# Patient Record
Sex: Female | Born: 1990 | Race: White | Hispanic: No | Marital: Married | Smoking: Never smoker
Health system: Southern US, Community
[De-identification: ages and names within clinical notes are randomized; demographics above are authoritative.]

## PROBLEM LIST (undated history)

## (undated) DIAGNOSIS — J309 Allergic rhinitis, unspecified: Secondary | ICD-10-CM

## (undated) DIAGNOSIS — K8020 Calculus of gallbladder without cholecystitis without obstruction: Secondary | ICD-10-CM

## (undated) DIAGNOSIS — T7840XA Allergy, unspecified, initial encounter: Secondary | ICD-10-CM

## (undated) DIAGNOSIS — Z803 Family history of malignant neoplasm of breast: Secondary | ICD-10-CM

## (undated) DIAGNOSIS — F32A Depression, unspecified: Secondary | ICD-10-CM

## (undated) DIAGNOSIS — F419 Anxiety disorder, unspecified: Secondary | ICD-10-CM

## (undated) DIAGNOSIS — M1990 Unspecified osteoarthritis, unspecified site: Secondary | ICD-10-CM

## (undated) DIAGNOSIS — N200 Calculus of kidney: Secondary | ICD-10-CM

## (undated) HISTORY — PX: CHOLECYSTECTOMY: SHX55

## (undated) HISTORY — DX: Unspecified osteoarthritis, unspecified site: M19.90

## (undated) HISTORY — DX: Depression, unspecified: F32.A

## (undated) HISTORY — DX: Anxiety disorder, unspecified: F41.9

## (undated) HISTORY — DX: Family history of malignant neoplasm of breast: Z80.3

## (undated) HISTORY — DX: Calculus of gallbladder without cholecystitis without obstruction: K80.20

## (undated) HISTORY — PX: OTHER SURGICAL HISTORY: SHX169

## (undated) HISTORY — DX: Allergy, unspecified, initial encounter: T78.40XA

## (undated) HISTORY — DX: Allergic rhinitis, unspecified: J30.9

## (undated) HISTORY — DX: Calculus of kidney: N20.0

---

## 2012-08-03 ENCOUNTER — Emergency Department (HOSPITAL_COMMUNITY)
Admission: EM | Admit: 2012-08-03 | Discharge: 2012-08-03 | Disposition: A | Discharge status: Home / Self Care | Payer: Self-pay | Attending: Emergency Medicine | Admitting: Emergency Medicine

## 2012-08-03 ENCOUNTER — Emergency Department (HOSPITAL_COMMUNITY): Payer: Self-pay

## 2012-08-03 ENCOUNTER — Encounter (HOSPITAL_COMMUNITY): Payer: Self-pay | Admitting: Emergency Medicine

## 2012-08-03 DIAGNOSIS — N200 Calculus of kidney: Secondary | ICD-10-CM

## 2012-08-03 LAB — URINE MICROSCOPIC-ADD ON

## 2012-08-03 LAB — CBC
HCT: 37 % (ref 36.0–46.0)
Hemoglobin: 12.5 g/dL (ref 12.0–15.0)
MCV: 88.3 fL (ref 78.0–100.0)
RDW: 12.8 % (ref 11.5–15.5)
WBC: 6.9 10*3/uL (ref 4.0–10.5)

## 2012-08-03 LAB — COMPREHENSIVE METABOLIC PANEL
Albumin: 4.3 g/dL (ref 3.5–5.2)
Alkaline Phosphatase: 35 U/L — ABNORMAL LOW (ref 39–117)
BUN: 10 mg/dL (ref 6–23)
CO2: 23 mEq/L (ref 19–32)
Chloride: 105 mEq/L (ref 96–112)
Creatinine, Ser: 0.61 mg/dL (ref 0.50–1.10)
GFR calc Af Amer: >90 mL/min (ref >90)
GFR calc non Af Amer: >90 mL/min (ref >90)
Glucose, Bld: 87 mg/dL (ref 70–99)
Potassium: 3.4 mEq/L — ABNORMAL LOW (ref 3.5–5.1)
Total Bilirubin: 0.2 mg/dL — ABNORMAL LOW (ref 0.3–1.2)

## 2012-08-03 LAB — URINALYSIS, ROUTINE W REFLEX MICROSCOPIC
Glucose, UA: NEGATIVE mg/dL
Protein, ur: NEGATIVE mg/dL
pH: 5.5 (ref 5.0–8.0)

## 2012-08-03 LAB — POCT PREGNANCY, URINE: Preg Test, Ur: NEGATIVE

## 2012-08-03 MED ORDER — HYDROCODONE-ACETAMINOPHEN 5-500 MG PO TABS
1.0000 | ORAL_TABLET | Freq: Four times a day (QID) | ORAL | Status: AC | PRN
Start: 2012-08-03 — End: 2012-08-13

## 2012-08-03 NOTE — ED Notes (Signed)
PT is eatting; encouraged not to eat anymore; pt has no signs of distress at this time.

## 2012-08-03 NOTE — ED Notes (Signed)
Pt to xray

## 2012-08-03 NOTE — ED Notes (Signed)
Pt reported increased pain; RN reassured pt and encouraged her to use deep breathing; pt is afebrile.

## 2012-08-03 NOTE — ED Notes (Signed)
RLQ pain that radiates to R flank since 3:15pm today.  Reports diarrhea.  Denies urinary complaints, nausea, and vomiting.

## 2012-08-03 NOTE — ED Provider Notes (Signed)
History     CSN: [id number]  Arrival date & time 08/03/12  1706   First MD Initiated Contact with Patient 08/03/12 2114      Chief Complaint  Patient presents with  . Abdominal Pain    (Consider location/radiation/quality/duration/timing/severity/associated sxs/prior treatment) HPI Comments: Patient started at 3 PM with right flank pain radiating in to the right lower quadrant.  It is severe at times and seems to come and go.  Never had before.  Patient is a 21 y.o. female presenting with abdominal pain. The history is provided by the patient.  Abdominal Pain The primary symptoms of the illness include abdominal pain and nausea. The primary symptoms of the illness do not include vomiting, diarrhea or dysuria. Episode onset: 3 PM today. The onset of the illness was sudden. The problem has been rapidly worsening.  The patient states that she believes she is currently not pregnant. The patient has not had a change in bowel habit. Symptoms associated with the illness do not include chills, constipation, urgency, hematuria or frequency.    History reviewed. No pertinent past medical history.  History reviewed. No pertinent past surgical history.  No family history on file.  History  Substance Use Topics  . Smoking status: Never Smoker   . Smokeless tobacco: Not on file  . Alcohol Use: No    OB History    Grav Para Term Preterm Abortions TAB SAB Ect Mult Living                  Review of Systems  Constitutional: Negative for chills.  Gastrointestinal: Positive for nausea and abdominal pain. Negative for vomiting, diarrhea and constipation.  Genitourinary: Negative for dysuria, urgency, frequency and hematuria.  All other systems reviewed and are negative.    Allergies  Review of patient's allergies indicates no known allergies.  Home Medications  No current outpatient prescriptions on file.  BP 125/81  Pulse 105  Temp 98.2 F (36.8 C) (Oral)  Resp 17  SpO2 100%   LMP 07/06/2012  Physical Exam  Nursing note reviewed. Constitutional: She is oriented to person, place, and time. She appears well-developed and well-nourished. No distress.  HENT:  Head: Normocephalic and atraumatic.  Mouth/Throat: Oropharynx is clear and moist.  Neck: Normal range of motion. Neck supple.  Cardiovascular: Normal rate and regular rhythm.   No murmur heard. Pulmonary/Chest: Effort normal and breath sounds normal. No respiratory distress.  Abdominal: Soft. Bowel sounds are normal. She exhibits no distension. There is no tenderness.  Musculoskeletal: Normal range of motion.  Neurological: She is alert and oriented to person, place, and time.  Skin: Skin is warm and dry. She is not diaphoretic.    ED Course  Procedures (including critical care time)  Labs Reviewed  URINALYSIS, ROUTINE W REFLEX MICROSCOPIC - Abnormal; Notable for the following:    APPearance CLOUDY (*)     Hgb urine dipstick LARGE (*)     All other components within normal limits  COMPREHENSIVE METABOLIC PANEL - Abnormal; Notable for the following:    Potassium 3.4 (*)     Alkaline Phosphatase 35 (*)     Total Bilirubin 0.2 (*)     All other components within normal limits  URINE MICROSCOPIC-ADD ON - Abnormal; Notable for the following:    Squamous Epithelial / LPF FEW (*)     Bacteria, UA FEW (*)     All other components within normal limits  CBC  POCT PREGNANCY, URINE   No results  found.   No diagnosis found.    MDM  The patient presents with recurrent episodes of right flank pain that have been severe.  The workup shows hematuria and the ct scan reveals a recently passed kidney stone.  She is feeling better and will be discharged to home.  To return prn if worsens.          Geoffery Lyons, MD 08/03/12 [phone]

## 2013-12-28 ENCOUNTER — Ambulatory Visit (INDEPENDENT_AMBULATORY_CARE_PROVIDER_SITE_OTHER): Payer: BC Managed Care – PPO | Admitting: General Surgery

## 2014-01-19 ENCOUNTER — Ambulatory Visit (INDEPENDENT_AMBULATORY_CARE_PROVIDER_SITE_OTHER): Payer: BC Managed Care – PPO | Admitting: General Surgery

## 2014-01-24 ENCOUNTER — Encounter (INDEPENDENT_AMBULATORY_CARE_PROVIDER_SITE_OTHER): Payer: Self-pay | Admitting: General Surgery

## 2015-03-02 LAB — HM PAP SMEAR: HM Pap smear: NORMAL

## 2015-10-14 ENCOUNTER — Encounter: Payer: Self-pay | Admitting: Family Medicine

## 2015-10-14 ENCOUNTER — Ambulatory Visit (INDEPENDENT_AMBULATORY_CARE_PROVIDER_SITE_OTHER): Payer: BLUE CROSS/BLUE SHIELD | Admitting: Family Medicine

## 2015-10-14 VITALS — BP 130/72 | Temp 98.5°F | Ht 65.0 in | Wt 135.0 lb

## 2015-10-14 DIAGNOSIS — N3000 Acute cystitis without hematuria: Secondary | ICD-10-CM | POA: Diagnosis not present

## 2015-10-14 DIAGNOSIS — Z803 Family history of malignant neoplasm of breast: Secondary | ICD-10-CM

## 2015-10-14 DIAGNOSIS — J309 Allergic rhinitis, unspecified: Secondary | ICD-10-CM | POA: Insufficient documentation

## 2015-10-14 DIAGNOSIS — K8020 Calculus of gallbladder without cholecystitis without obstruction: Secondary | ICD-10-CM | POA: Diagnosis not present

## 2015-10-14 DIAGNOSIS — R109 Unspecified abdominal pain: Secondary | ICD-10-CM | POA: Diagnosis not present

## 2015-10-14 DIAGNOSIS — Z87442 Personal history of urinary calculi: Secondary | ICD-10-CM

## 2015-10-14 NOTE — Assessment & Plan Note (Signed)
mother died at 7835. yearly mammograms starting 5925 through [facility]. Mammogram 03/07/15

## 2015-10-14 NOTE — Assessment & Plan Note (Signed)
S: January 2016 woke up and felt pain throughout abdomen. Night before had fried chicken tenders. Went to urgent care [place] [address] and was told had gas issues. Pain was similar to prior kidney stone pain. Had u/s and showed gallstones. Scheduled consultation with surgery- rescheduled and then cancelled it. Tried a few "natural cleanses". Currently has intermittent issues. When diet was very clean had less issues. Now not quite as strict and fatty foods or meat cause discomfort in RUQ.  A/P: Patient asks me about medical dissolution therapy. Given prior pain was near level of kidney stone after fatty meal- suspect surgical intervention is her better option. She pursue this once she feels like she is in better financial footing. Will drop off disc with her records as well- hopeful we can print and get in system.

## 2015-10-14 NOTE — Progress Notes (Signed)
Susan ConchStephen Velton Roselle, MD Phone: [phone]  Subjective:  Patient presents today to establish care. Chief complaint-noted.   See problem oriented charting  The following were reviewed and entered/updated in epic: Past Medical History  Diagnosis Date  . Arthritis     R knee- seen ortho  . Kidney stone     2013, 2014 x2- passed without intervention  . Gallstones   . Allergic rhinitis   . Family history of breast cancer     mother died at 6035. yearly mammograms starting 6625 through [facility]   Patient Active Problem List   Diagnosis Date Noted  . Gallstones     Priority: Medium  . Family history of breast cancer     Priority: Medium  . Allergic rhinitis     Priority: Low   Past Surgical History  Procedure Laterality Date  . None      Family History  Problem Relation Age of Onset  . Breast cancer Mother     died 135  . Suicidality Father     father in 8130s after wife died  . Hyperlipidemia Father   . Hypertension Father     Medications- reviewed and updated No current outpatient prescriptions on file.   No current facility-administered medications for this visit.    Allergies-reviewed and updated No Known Allergies  Social History   Social History  . Marital Status: Single    Spouse Name: N/A  . Number of Children: N/A  . Years of Education: N/A   Social History Main Topics  . Smoking status: Never Smoker   . Smokeless tobacco: None  . Alcohol Use: No  . Drug Use: No  . Sexual Activity: Not Asked   Other Topics Concern  . 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        ROS--Full ROS  Review of Systems  Constitutional: Negative for fever, chills and weight loss.  HENT: Negative for hearing loss.   Eyes: Negative for blurred vision and double vision.  Respiratory: Negative for  cough and hemoptysis.   Cardiovascular: Negative for chest pain and palpitations.  Gastrointestinal: Negative for heartburn and nausea.  Musculoskeletal: Negative for myalgias and neck pain.  Skin: Negative for itching and rash.  Neurological: Negative for dizziness, tingling, seizures and headaches.  Endo/Heme/Allergies: Negative for polydipsia. Does not bruise/bleed easily.  Psychiatric/Behavioral: Negative for hallucinations and memory loss.   Objective: BP 130/72 mmHg  Temp(Src) 98.5 F (36.9 C)  Ht 5\' 5"  (1.651 m)  Wt 135 lb (61.236 kg)  BMI 22.47 kg/m2 Gen: NAD, resting comfortably HEENT: Mucous membranes are moist. Oropharynx normal. TM normal. Eyes: sclera and lids normal, PERRLA Neck: no thyromegaly, no cervical lymphadenopathy CV: RRR no murmurs rubs or gallops Lungs: CTAB no crackles, wheeze, rhonchi Abdomen: soft/diffuse tenderness mild- no worse suprapubic or RUQ/nondistended/normal bowel sounds. No rebound or guarding.  Ext: no edema Skin: warm, dry Neuro: 5/5 strength in upper and lower extremities, normal gait, normal reflexes  Assessment/Plan:  Gallstones S: January 2016 woke up and felt pain throughout abdomen. Night before had fried chicken tenders. Went to urgent care [place] [address] and was told had gas issues. Pain was similar to prior kidney stone pain. Had u/s and showed gallstones. Scheduled consultation with surgery- rescheduled and then cancelled it. Tried a few "natural cleanses". Currently  has intermittent issues. When diet was very clean had less issues. Now not quite as strict and fatty foods or meat cause discomfort in RUQ.  A/P: Patient asks me about medical dissolution therapy. Given prior pain was near level of kidney stone after fatty meal- suspect surgical intervention is her better option. She pursue this once she feels like she is in better financial footing. Will drop off disc with her records as well- hopeful we can print and get in system.    Family history of breast cancer mother died at 25. yearly mammograms starting 43 through [facility]. Mammogram 03/07/15  UTI History nephrolithiasis S:Around the 10th of this month went to urgent care for her throat. Tested urine and blood and was told had pharyngitis and told had bladder infection (had no symptoms). Put her on ceflacor for throat and bladder. Developed pain in CVA and suprapubic tenderness. Went back on Monday and changed her to cipro. Improving currently.  A/P: check urine culture to make sure on appropriate antibiotic as not clear this has been done. Finish cipro course. Patient also is very concerned about her R CVA tenderness being recurrent kidney stone. Will get nonconstrast CT to evaluate this.   Return precautions advised. At least be seen every other year but also for new or worsening symptoms.   Orders Placed This Encounter  Procedures  . Urine culture    [facility]  . CT RENAL STONE STUDY    Pt aware of appt  Debra/LY. BC/BS. Will get pre cert    Standing Status: Future     Number of Occurrences:      Standing Expiration Date: 01/13/2017    Order Specific Question:  Reason for Exam (SYMPTOM  OR DIAGNOSIS REQUIRED)    Answer:  history neprholithiasis. Recent R CVA pain with UTI.    Order Specific Question:  Is the patient pregnant?    Answer:  No    Order Specific Question:  Preferred imaging location?    Answer:  [facility]

## 2015-10-14 NOTE — Patient Instructions (Addendum)
Sign a release of information at the front dest to have copy of OBGYN records sent to us at [phone], specifically pap smear. I would also get records from your recent ultrasound from urgent care  Send urine for culture to make sure on right antibiotic  Send for CT scan to evaluate for any potential kidney stones that could be complicating UTI  Really nice to meet you!   Would advise even if things are going well seeing us at least every 2 years- I can provide referral for surgeon whenever you are ready

## 2015-10-15 ENCOUNTER — Inpatient Hospital Stay: Admission: RE | Admit: 2015-10-15 | Payer: BLUE CROSS/BLUE SHIELD | Source: Ambulatory Visit

## 2015-10-16 ENCOUNTER — Encounter: Payer: Self-pay | Admitting: Family Medicine

## 2015-10-16 LAB — URINE CULTURE
Colony Count: NO GROWTH
Organism ID, Bacteria: NO GROWTH

## 2017-03-07 ENCOUNTER — Ambulatory Visit (HOSPITAL_COMMUNITY)
Admission: EM | Admit: 2017-03-07 | Discharge: 2017-03-07 | Disposition: A | Discharge status: Home / Self Care | Payer: BLUE CROSS/BLUE SHIELD | Attending: Internal Medicine | Admitting: Internal Medicine

## 2017-03-07 ENCOUNTER — Encounter (HOSPITAL_COMMUNITY): Payer: Self-pay | Admitting: *Deleted

## 2017-03-07 DIAGNOSIS — R350 Frequency of micturition: Secondary | ICD-10-CM | POA: Diagnosis not present

## 2017-03-07 DIAGNOSIS — Z3202 Encounter for pregnancy test, result negative: Secondary | ICD-10-CM

## 2017-03-07 DIAGNOSIS — R102 Pelvic and perineal pain: Secondary | ICD-10-CM

## 2017-03-07 LAB — POCT URINALYSIS DIP (DEVICE)
BILIRUBIN URINE: NEGATIVE
Glucose, UA: NEGATIVE mg/dL
HGB URINE DIPSTICK: NEGATIVE
KETONES UR: NEGATIVE mg/dL
LEUKOCYTES UA: NEGATIVE
NITRITE: NEGATIVE
Protein, ur: NEGATIVE mg/dL
Specific Gravity, Urine: 1.01 (ref 1.005–1.030)
Urobilinogen, UA: 0.2 mg/dL (ref 0.0–1.0)
pH: 6 (ref 5.0–8.0)

## 2017-03-07 LAB — POCT PREGNANCY, URINE: Preg Test, Ur: NEGATIVE

## 2017-03-07 NOTE — ED Provider Notes (Signed)
CSN: [id number]     Arrival date & time 03/07/17  1529 History   None    Chief Complaint  Patient presents with  . Dysuria   (Consider location/radiation/quality/duration/timing/severity/associated sxs/prior Treatment) 26 year old female complaining of intermittent bladder pressure and discomfort with urinary frequency and odor to the urine for 6 months. She has no symptoms today. Urinalysis is negative. She thought since a significant other was being seen at the urgent care today she would check and to see if she could be evaluated for this today. She is currently stable and asymptomatic.      Past Medical History:  Diagnosis Date  . Allergic rhinitis   . Arthritis    R knee- seen ortho  . Family history of breast cancer    mother died at 98. yearly mammograms starting 52 through [facility]  . Gallstones   . Kidney stone    2013, 2014 x2- passed without intervention   History reviewed. No pertinent surgical history. Family History  Problem Relation Age of Onset  . Breast cancer Mother     died 47  . Suicidality Father     father in 31s after wife died  . Hyperlipidemia Father   . Hypertension Father    Social History  Substance Use Topics  . Smoking status: Never Smoker  . Smokeless tobacco: Not on file  . Alcohol use No   OB History    No data available     Review of Systems  Constitutional: Negative.   Respiratory: Negative.   Gastrointestinal: Negative.   Genitourinary:       As per history of present illness. Asymptomatic today.  Neurological: Negative.   All other systems reviewed and are negative.   Allergies  Patient has no known allergies.  Home Medications   Prior to Admission medications   Not on File   Meds Ordered and Administered this Visit  Medications - No data to display  BP 135/73   Pulse 90   Temp 98.7 F (37.1 C) (Oral)   Resp 16   LMP 02/14/2017 (Approximate)   SpO2 97%  No data found.   Physical Exam  Constitutional: She is  oriented to person, place, and time. She appears well-developed and well-nourished. No distress.  Eyes: EOM are normal.  Neck: Normal range of motion. Neck supple.  Cardiovascular: Normal rate.   Pulmonary/Chest: Effort normal. No respiratory distress.  Abdominal: Soft. She exhibits no mass. There is no tenderness. There is no guarding.  No tenderness over the midline above the supra pubic bone, no anterior pelvic tenderness. No abdominal tenderness.  Musculoskeletal: She exhibits no edema.  Neurological: She is alert and oriented to person, place, and time. She exhibits normal muscle tone.  Skin: Skin is warm and dry.  Psychiatric: She has a normal mood and affect.  Nursing note and vitals reviewed.   Urgent Care Course     Procedures (including critical care time)  Labs Review Labs Reviewed  POCT URINALYSIS DIP (DEVICE)  POCT PREGNANCY, URINE   Results for orders placed or performed during the hospital encounter of 03/07/17  POCT urinalysis dip (device)  Result Value Ref Range   Glucose, UA NEGATIVE NEGATIVE mg/dL   Bilirubin Urine NEGATIVE NEGATIVE   Ketones, ur NEGATIVE NEGATIVE mg/dL   Specific Gravity, Urine 1.010 1.005 - 1.030   Hgb urine dipstick NEGATIVE NEGATIVE   pH 6.0 5.0 - 8.0   Protein, ur NEGATIVE NEGATIVE mg/dL   Urobilinogen, UA 0.2 0.0 - 1.0 mg/dL  Nitrite NEGATIVE NEGATIVE   Leukocytes, UA NEGATIVE NEGATIVE  Pregnancy, urine POC  Result Value Ref Range   Preg Test, Ur NEGATIVE NEGATIVE    Imaging Review No results found.   Visual Acuity Review  Right Eye Distance:   Left Eye Distance:   Bilateral Distance:    Right Eye Near:   Left Eye Near:    Bilateral Near:         MDM   1. Urinary frequency   2. Pelvic pressure in female    Read instructions above for information. Can follow-up with her urologist.     Hayden Rasmussen, NP 03/07/17 1743

## 2017-03-07 NOTE — Discharge Instructions (Signed)
Read instructions above for information. Can follow-up with her urologist.

## 2017-03-07 NOTE — ED Triage Notes (Signed)
C/O bladder pressure and polyuria and occasional malodorous urine intermittently over past 6 months.

## 2017-03-17 ENCOUNTER — Telehealth: Payer: Self-pay | Admitting: Family Medicine

## 2017-03-17 ENCOUNTER — Ambulatory Visit (HOSPITAL_COMMUNITY)
Admission: EM | Admit: 2017-03-17 | Discharge: 2017-03-17 | Disposition: A | Discharge status: Other Acute Inpatient Hospital | Payer: BLUE CROSS/BLUE SHIELD

## 2017-03-17 NOTE — Telephone Encounter (Signed)
Called and left a voicemail message asking for a return phone call 

## 2017-03-17 NOTE — Telephone Encounter (Signed)
Pt scheduled for 03/23/17

## 2017-03-17 NOTE — Telephone Encounter (Signed)
° ° ° °  Pt call to say she had another gall bladder attack and is asking if she need to have an [place] and see a Careers adviser. Would like a call back

## 2017-03-17 NOTE — Telephone Encounter (Signed)
Over 18 months since last seen- probably ideal to have her in for a visit.   If she declines this and has her old CD with images from prior ultrasound- I would also be agreeable to surgical referral.

## 2017-03-23 ENCOUNTER — Ambulatory Visit (INDEPENDENT_AMBULATORY_CARE_PROVIDER_SITE_OTHER): Payer: Self-pay | Admitting: Family Medicine

## 2017-03-23 ENCOUNTER — Encounter: Payer: Self-pay | Admitting: Family Medicine

## 2017-03-23 VITALS — BP 116/78 | HR 99 | Temp 98.4°F | Ht 65.0 in | Wt 147.6 lb

## 2017-03-23 DIAGNOSIS — R1011 Right upper quadrant pain: Secondary | ICD-10-CM

## 2017-03-23 DIAGNOSIS — K8020 Calculus of gallbladder without cholecystitis without obstruction: Secondary | ICD-10-CM

## 2017-03-23 LAB — CBC WITH DIFFERENTIAL/PLATELET
BASOS ABS: 0.1 10*3/uL (ref 0.0–0.1)
Basophils Relative: 1 % (ref 0.0–3.0)
Eosinophils Absolute: 0.1 10*3/uL (ref 0.0–0.7)
Eosinophils Relative: 1.7 % (ref 0.0–5.0)
HEMATOCRIT: 39.1 % (ref 36.0–46.0)
Hemoglobin: 13.2 g/dL (ref 12.0–15.0)
LYMPHS PCT: 30 % (ref 12.0–46.0)
Lymphs Abs: 1.8 10*3/uL (ref 0.7–4.0)
MCHC: 33.9 g/dL (ref 30.0–36.0)
MCV: 88 fl (ref 78.0–100.0)
MONO ABS: 0.6 10*3/uL (ref 0.1–1.0)
Monocytes Relative: 10.3 % (ref 3.0–12.0)
NEUTROS ABS: 3.3 10*3/uL (ref 1.4–7.7)
NEUTROS PCT: 57 % (ref 43.0–77.0)
PLATELETS: 214 10*3/uL (ref 150.0–400.0)
RBC: 4.44 Mil/uL (ref 3.87–5.11)
RDW: 12.9 % (ref 11.5–15.5)
WBC: 5.8 10*3/uL (ref 4.0–10.5)

## 2017-03-23 LAB — COMPREHENSIVE METABOLIC PANEL
ALT: 23 U/L (ref 0–35)
AST: 16 U/L (ref 0–37)
Albumin: 4.6 g/dL (ref 3.5–5.2)
Alkaline Phosphatase: 37 U/L — ABNORMAL LOW (ref 39–117)
BILIRUBIN TOTAL: 0.5 mg/dL (ref 0.2–1.2)
BUN: 12 mg/dL (ref 6–23)
CALCIUM: 9.7 mg/dL (ref 8.4–10.5)
CO2: 28 meq/L (ref 19–32)
Chloride: 103 mEq/L (ref 96–112)
Creatinine, Ser: 0.66 mg/dL (ref 0.40–1.20)
GFR: 113.95 mL/min (ref >60.00)
GLUCOSE: 83 mg/dL (ref 70–99)
POTASSIUM: 3.8 meq/L (ref 3.5–5.1)
SODIUM: 138 meq/L (ref 135–145)
Total Protein: 7.2 g/dL (ref 6.0–8.3)

## 2017-03-23 NOTE — Assessment & Plan Note (Signed)
RUQ pain S: patient has had some right upper quadrant pain for at least 2 years intermittently. Ultrasound at that time showed gallstones. Had surgery consultation planned but cancelled this as she wanted to trial a natural cleanse. Was having intermittent issues when I saw her 18 months ago but had not been quite as clean on her diet.   Last week at 1 45 AM Wednesday morning- noted pain spreading down into abdomen and then into lower and upper back. She admits to having french fries the night before as a splurge. Felt very similar to episode 2 years ago. Had a remaining hydrocodone- pain stopped around 4 AM then called out  of work and went into get seen at urgent care. If she does have stones, wants to consider dissolution therapy over surgery A/P: We will get her set up for a RUQ ultrasound. Labs as above are reassuring. Suspect this is gallstone related pain. We discussed CT as an option with possibility of kidney stones in past- she declines this as currently without insurance and ultrasound likely less expensive than CT- discussed she could price shop . She knows to follow up with new or recurrent symptoms.

## 2017-03-23 NOTE — Patient Instructions (Signed)
I am concerned about gallstones as cause of intermittent pain. Would try to go low fat in the diet.   Please stop by lab before you go. My guestimate for labs would be around $100  We will call you within a week or two about your referral for ultrasound of the gallbladder. If you do not hear within 3 weeks, give [place] a call.   I would let them know you are self pay so you can know cost up front. Brief search showed ranges from 200-650$.

## 2017-03-23 NOTE — Progress Notes (Signed)
Pre visit review using our clinic review tool, if applicable. No additional management support is needed unless otherwise documented below in the visit note. 

## 2017-03-23 NOTE — Progress Notes (Signed)
Subjective:  Susan Raymond is a 26 y.o. year old very pleasant female patient who presents for/with See problem oriented charting ROS- right upper quadrant pain. No nausea or vomiting. No fever or chills   Past Medical History-  Patient Active Problem List   Diagnosis Date Noted  . Gallstones     Priority: Medium  . Family history of breast cancer     Priority: Medium  . Allergic rhinitis     Priority: Low   Medications- reviewed and updated, none  Updated social history Social History   Social History Narrative   Family: Single, lives with younger brother and grandmother      Work: Warehouse manager. Worked as Engineer, site at surgical center.    Back to school 2018- wants to get BSN at [employer] the idea of travel nursing   Objective: BP 116/78 (BP Location: Left Arm, Patient Position: Sitting, Cuff Size: Large)   Pulse 99   Temp 98.4 F (36.9 C) (Oral)   Ht  (1.651 m)   Wt 147 lb 9.6 oz (67 kg)   SpO2 96%   BMI 24.56 kg/m  Gen: NAD, resting comfortably CV: RRR no murmurs rubs or gallops Lungs: CTAB no crackles, wheeze, rhonchi Abdomen: soft/some pain with deep palpation in right upper quadrant/nondistended/normal bowel sounds. No rebound or guarding.  Ext: no edema Skin: warm, dry  Results for orders placed or performed in visit on 03/23/17 (from the past 24 hour(s))  CBC with Differential/Platelet     Status: None   Collection Time: 03/23/17 12:27 PM  Result Value Ref Range   WBC 5.8 4.0 - 10.5 K/uL   RBC 4.44 3.87 - 5.11 Mil/uL   Hemoglobin 13.2 12.0 - 15.0 g/dL   HCT 16.1 09.6 - 04.5 %   MCV 88.0 78.0 - 100.0 fl   MCHC 33.9 30.0 - 36.0 g/dL   RDW 40.9 81.1 - 91.4 %   Platelets 214.0 150.0 - 400.0 K/uL   Neutrophils Relative % 57.0 43.0 - 77.0 %   Lymphocytes Relative 30.0 12.0 - 46.0 %   Monocytes Relative 10.3 3.0 - 12.0 %   Eosinophils Relative 1.7 0.0 - 5.0 %   Basophils Relative 1.0 0.0 - 3.0 %   Neutro Abs 3.3 1.4 - 7.7 K/uL    Lymphs Abs 1.8 0.7 - 4.0 K/uL   Monocytes Absolute 0.6 0.1 - 1.0 K/uL   Eosinophils Absolute 0.1 0.0 - 0.7 K/uL   Basophils Absolute 0.1 0.0 - 0.1 K/uL  Comprehensive metabolic panel     Status: Abnormal   Collection Time: 03/23/17 12:27 PM  Result Value Ref Range   Sodium 138 135 - 145 mEq/L   Potassium 3.8 3.5 - 5.1 mEq/L   Chloride 103 96 - 112 mEq/L   CO2 28 19 - 32 mEq/L   Glucose, Bld 83 70 - 99 mg/dL   BUN 12 6 - 23 mg/dL   Creatinine, Ser 7.82 0.40 - 1.20 mg/dL   Total Bilirubin 0.5 0.2 - 1.2 mg/dL   Alkaline Phosphatase 37 (L) 39 - 117 U/L   AST 16 0 - 37 U/L   ALT 23 0 - 35 U/L   Total Protein 7.2 6.0 - 8.3 g/dL   Albumin 4.6 3.5 - 5.2 g/dL   Calcium 9.7 8.4 - 95.6 mg/dL   GFR 213.08 >65.78 mL/min   Assessment/Plan:  Gallstones RUQ pain S: patient has had some right upper quadrant pain for at least 2 years  intermittently. Ultrasound at that time showed gallstones. Had surgery consultation planned but cancelled this as she wanted to trial a natural cleanse. Was having intermittent issues when I saw her 18 months ago but had not been quite as clean on her diet.   Last week at 1 45 AM Wednesday morning- noted pain spreading down into abdomen and then into lower and upper back. She admits to having french fries the night before as a splurge. Felt very similar to episode 2 years ago. Had a remaining hydrocodone- pain stopped around 4 AM then called out  of work and went into get seen at urgent care. If she does have stones, wants to consider dissolution therapy over surgery A/P: We will get her set up for a RUQ ultrasound. Labs as above are reassuring. Suspect this is gallstone related pain. We discussed CT as an option with possibility of kidney stones in past- she declines this as currently without insurance and ultrasound likely less expensive than CT- discussed she could price shop . She knows to follow up with new or recurrent symptoms.   Orders Placed This Encounter   Procedures  . US Abdomen Limited RUQ    Standing Status:   Future    Standing Expiration Date:   05/23/2018    Order Specific Question:   Reason for Exam (SYMPTOM  OR DIAGNOSIS REQUIRED)    Answer:   RUQ pain- prior gallstones    Order Specific Question:   Preferred imaging location?    Answer:   [facility]  . CBC with Differential/Platelet  . Comprehensive metabolic panel    [place]   Return precautions advised.  Tana Conch, MD

## 2017-04-05 ENCOUNTER — Ambulatory Visit
Admission: RE | Admit: 2017-04-05 | Discharge: 2017-04-05 | Disposition: A | Discharge status: Home / Self Care | Payer: No Typology Code available for payment source | Source: Ambulatory Visit | Attending: Family Medicine | Admitting: Family Medicine

## 2017-04-05 DIAGNOSIS — R1011 Right upper quadrant pain: Secondary | ICD-10-CM

## 2017-04-06 ENCOUNTER — Other Ambulatory Visit: Payer: Self-pay | Admitting: Family Medicine

## 2017-04-06 DIAGNOSIS — K8020 Calculus of gallbladder without cholecystitis without obstruction: Secondary | ICD-10-CM

## 2017-09-16 ENCOUNTER — Telehealth: Payer: Self-pay | Admitting: Family Medicine

## 2017-09-16 DIAGNOSIS — Z789 Other specified health status: Secondary | ICD-10-CM

## 2017-09-16 NOTE — Telephone Encounter (Signed)
I ordered this

## 2017-09-16 NOTE — Telephone Encounter (Signed)
Patient needs orders in for titer for chicken pox. Please advise.

## 2017-09-16 NOTE — Addendum Note (Signed)
Addended by: Shelva MajesticHUNTER, STEPHEN O on: 09/16/2017 05:55 PM   Modules accepted: Orders

## 2017-09-17 ENCOUNTER — Other Ambulatory Visit (INDEPENDENT_AMBULATORY_CARE_PROVIDER_SITE_OTHER): Payer: Self-pay

## 2017-09-17 ENCOUNTER — Ambulatory Visit (INDEPENDENT_AMBULATORY_CARE_PROVIDER_SITE_OTHER): Payer: Self-pay | Admitting: *Deleted

## 2017-09-17 DIAGNOSIS — Z23 Encounter for immunization: Secondary | ICD-10-CM

## 2017-09-17 DIAGNOSIS — Z789 Other specified health status: Secondary | ICD-10-CM

## 2017-09-17 NOTE — Progress Notes (Signed)
Pt received Tdap, documentation given to pt to present to school.

## 2017-09-17 NOTE — Progress Notes (Signed)
I have reviewed and agree with note, evaluation, plan.   Stephen Hunter, MD  

## 2017-09-20 LAB — VARICELLA ZOSTER ANTIBODY, IGG: Varicella IgG: 348.5 index

## 2018-03-13 ENCOUNTER — Encounter: Payer: Self-pay | Admitting: Family Medicine

## 2018-03-28 ENCOUNTER — Ambulatory Visit (INDEPENDENT_AMBULATORY_CARE_PROVIDER_SITE_OTHER): Payer: Self-pay | Admitting: Family Medicine

## 2018-03-28 ENCOUNTER — Encounter: Payer: Self-pay | Admitting: Family Medicine

## 2018-03-28 ENCOUNTER — Encounter

## 2018-03-28 VITALS — BP 116/80 | HR 82 | Temp 98.9°F | Ht 65.0 in | Wt 148.4 lb

## 2018-03-28 DIAGNOSIS — K8020 Calculus of gallbladder without cholecystitis without obstruction: Secondary | ICD-10-CM

## 2018-03-28 NOTE — Patient Instructions (Addendum)
Health Maintenance Due  Topic Date Due  . HIV Screening - Patient Declined 12/14/2004  . PAP SMEAR - Patient will schedule a visit with her OB-Gyn. 03/01/2018   We will call you within a week or two about your referral to general surgery. If you do not hear within 3 weeks, give [place] a call.

## 2018-03-28 NOTE — Progress Notes (Signed)
Subjective:  Susan Raymond is a 27 y.o. year old very pleasant female patient who presents for/with See problem oriented charting ROS-no nausea or vomiting.  No fever or chills.  Past Medical History-  Patient Active Problem List   Diagnosis Date Noted  . Gallstones     Priority: Medium  . Family history of breast cancer     Priority: Medium  . Allergic rhinitis     Priority: Low    Medications- reviewed and updated No current outpatient medications on file.   No current facility-administered medications for this visit.     Objective: BP 116/80 (BP Location: Left Arm, Patient Position: Sitting, Cuff Size: Normal)   Pulse 82   Temp 98.9 F (37.2 C) (Oral)   Ht  (1.651 m)   Wt 148 lb 6.4 oz (67.3 kg)   LMP 03/16/2018   SpO2 98%   BMI 24.70 kg/m  Gen: NAD, resting comfortably CV: RRR  Lungs: Nonlabored normal respiratory rate Abdomen: soft/slight tenderness with deep palpation of right upper quadrant/nondistended/normal bowel sounds.  Pulsatile aorta but she is thin. Ext: no edema Skin: warm, dry Neuro: Normal gait and speech  Assessment/Plan:  Gallstones S:  usually once a year would have attack from gallbladder pain over last 4 years. Then over last 12 days has felt 3 attacks- last one being on easter. Describes sharp intense pain - usually wakes her up early in the morning- pain up to 8-9/10 and lasts about 3-4 hours. Took tylenol and advil 2 of each and tried to rest- but difficult. No nausea or vomiting. No fever with this.   Last year referred to surgery- made the appointment then cancelled as feeling better.  Patient states she is ready to put this behind her and have surgery.  From ultrasound on 04/05/2017: "FINDINGS: Gallbladder:  2.2 cm gallstone noted. Gallbladder wall thickness 2.8 mm. Mildly positive Murphy sign by ultrasound.  Common bile duct:  Diameter: 4.0 mm  Liver:  No focal lesion identified. Within normal limits in  parenchymal echogenicity.  IMPRESSION: 2.2 cm gallstone.  Mildly positive Murphy sign by ultrasound."  A/P: We will refer patient to general surgery as she wants to pursue cholecystectomy now.  She does not have insurance at present but wants to set up a self-pay plan.  She may be getting insurance in June  Of note she asks about updating an ultrasound.  Given rather benign exam today I did not think this would change her planned management.  We also considered labs but once again did not think this would change management.  She would rather save her money for the surgery.  She is going out of the country for 12 days to the [place] states she definitely wants to go on this trip.  I encouraged her to seek care immediately if she has fever, chills, nausea, vomiting, worsening abdominal pain- we discussed these as signs of cholecystitis   Lab/Order associations: Calculus of gallbladder without cholecystitis without obstruction - Plan: Ambulatory referral to General Surgery  Return precautions advised.  Tana Conch, MD

## 2018-03-28 NOTE — Assessment & Plan Note (Signed)
S:  usually once a year would have attack from gallbladder pain over last 4 years. Then over last 12 days has felt 3 attacks- last one being on easter. Describes sharp intense pain - usually wakes her up early in the morning- pain up to 8-9/10 and lasts about 3-4 hours. Took tylenol and advil 2 of each and tried to rest- but difficult. No nausea or vomiting. No fever with this.   Last year referred to surgery- made the appointment then cancelled as feeling better.  Patient states she is ready to put this behind her and have surgery.  From ultrasound on 04/05/2017: "FINDINGS: Gallbladder:  2.2 cm gallstone noted. Gallbladder wall thickness 2.8 mm. Mildly positive Murphy sign by ultrasound.  Common bile duct:  Diameter: 4.0 mm  Liver:  No focal lesion identified. Within normal limits in parenchymal echogenicity.  IMPRESSION: 2.2 cm gallstone.  Mildly positive Murphy sign by ultrasound."  A/P: We will refer patient to general surgery as she wants to pursue cholecystectomy now.  She does not have insurance at present but wants to set up a self-pay plan.  She may be getting insurance in June  Of note she asks about updating an ultrasound.  Given rather benign exam today I did not think this would change her planned management.  We also considered labs but once again did not think this would change management.  She would rather save her money for the surgery.  She is going out of the country for 12 days to the [place] states she definitely wants to go on this trip.  I encouraged her to seek care immediately if she has fever, chills, nausea, vomiting, worsening abdominal pain- we discussed these as signs of cholecystitis

## 2019-09-11 ENCOUNTER — Other Ambulatory Visit: Payer: Self-pay

## 2019-09-11 DIAGNOSIS — Z20822 Contact with and (suspected) exposure to covid-19: Secondary | ICD-10-CM

## 2019-09-13 ENCOUNTER — Encounter: Payer: Self-pay | Admitting: Family Medicine

## 2019-09-13 ENCOUNTER — Other Ambulatory Visit: Payer: Self-pay

## 2019-09-13 DIAGNOSIS — Z20822 Contact with and (suspected) exposure to covid-19: Secondary | ICD-10-CM

## 2019-09-13 LAB — NOVEL CORONAVIRUS, NAA: SARS-CoV-2, NAA: NOT DETECTED

## 2019-09-14 LAB — NOVEL CORONAVIRUS, NAA: SARS-CoV-2, NAA: NOT DETECTED

## 2019-10-11 ENCOUNTER — Other Ambulatory Visit: Payer: Self-pay

## 2019-10-11 DIAGNOSIS — Z20822 Contact with and (suspected) exposure to covid-19: Secondary | ICD-10-CM

## 2019-10-13 LAB — NOVEL CORONAVIRUS, NAA: SARS-CoV-2, NAA: NOT DETECTED

## 2020-04-17 ENCOUNTER — Ambulatory Visit: Payer: Self-pay | Attending: Internal Medicine

## 2020-04-17 DIAGNOSIS — Z20822 Contact with and (suspected) exposure to covid-19: Secondary | ICD-10-CM

## 2020-04-18 LAB — SARS-COV-2, NAA 2 DAY TAT

## 2020-04-18 LAB — NOVEL CORONAVIRUS, NAA: SARS-CoV-2, NAA: NOT DETECTED

## 2020-08-09 ENCOUNTER — Other Ambulatory Visit: Payer: Self-pay | Admitting: Gynecology

## 2020-08-09 DIAGNOSIS — R928 Other abnormal and inconclusive findings on diagnostic imaging of breast: Secondary | ICD-10-CM

## 2020-08-21 ENCOUNTER — Ambulatory Visit: Payer: Self-pay

## 2020-08-21 ENCOUNTER — Other Ambulatory Visit: Payer: Self-pay

## 2020-08-21 ENCOUNTER — Ambulatory Visit
Admission: RE | Admit: 2020-08-21 | Discharge: 2020-08-21 | Disposition: A | Discharge status: Home / Self Care | Payer: 59 | Source: Ambulatory Visit | Attending: Gynecology | Admitting: Gynecology

## 2020-08-21 DIAGNOSIS — R928 Other abnormal and inconclusive findings on diagnostic imaging of breast: Secondary | ICD-10-CM

## 2020-08-27 ENCOUNTER — Encounter: Payer: Self-pay | Admitting: Family Medicine

## 2020-10-08 NOTE — Patient Instructions (Addendum)
Health Maintenance Due  Topic Date Due  . Hepatitis C Screening - with labs Never done  . COVID-19 Vaccine (1) Has not received the vaccine yet Never done  . HIV Screening - with labs Never done  . PAP SMEAR-Modifier Sign release to get recent pap results from Sept 2021 at check out desk 03/01/2018  . INFLUENZA VACCINE Declined in office flu shot today. 06/23/2020   Trial low fodmap diet for perhaps 6-8 weeks  We will call you within two weeks about your referral to GI. If you do not hear within 3 weeks, give [place] a call.   Please stop by lab before you go If you have mychart- we will send your results within 3 business days of [place] receiving them.  If you do not have mychart- we will call you about results within 5 business days of [place] receiving them.  *please note we are currently using [facility] which has a longer processing time than [place] typically so labs may not come back as quickly as in the past *please also note that you will see labs on mychart as soon as they post. I will later go in and write notes on them- will say "notes from Dr. Durene Cal"    Recommended follow up: Return in about 1 year (around 10/09/2021) for physical or sooner if needed. with GI issues

## 2020-10-08 NOTE — Progress Notes (Signed)
Phone [phone]   Subjective:  Patient presents today for their annual physical. Chief complaint-noted.   See problem oriented charting- ROS- full  review of systems was completed and negative except for: constipation, diarrhea, nausea, rectal pain, getting used to IUD- increased cramping pain  (rectal pain started before IUD), painful sex (gets rectal pain during sex). No hemorrhoids noted in the past.   The following were reviewed and entered/updated in epic: Past Medical History:  Diagnosis Date  . Allergic rhinitis   . Arthritis    R knee- seen ortho  . Family history of breast cancer    mother died at 71. yearly mammograms starting 40 through GYN  . Gallstones   . Kidney stone    2013, 2014 x2- passed without intervention   Patient Active Problem List   Diagnosis Date Noted  . Gallstones     Priority: Medium  . Family history of breast cancer     Priority: Medium  . Allergic rhinitis     Priority: Low   Past Surgical History:  Procedure Laterality Date  . CHOLECYSTECTOMY     2019    Family History  Problem Relation Age of Onset  . Breast cancer Mother        died 4  . Suicidality Father        father in 80s after wife died  . Hyperlipidemia Father   . Hypertension Father   . Breast cancer Maternal Grandmother   . Breast cancer Paternal Grandmother     Medications- reviewed and updated Current Outpatient Medications  Medication Sig Dispense Refill  . IBUPROFEN PO Take by mouth as needed.     No current facility-administered medications for this visit.    Allergies-reviewed and updated No Known Allergies  Social History   Social History Narrative   Family: Married to [place] boyfriend in 2021- planning to move there with him.       Work: Warehouse manager. Worked as Engineer, site at surgical center.    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    Objective  Objective:  BP 118/80   Pulse 84   Temp 98.2 F (36.8 C)  (Temporal)   Resp 18   Ht 5\' 4"  (1.626 m)   Wt 140 lb 3.2 oz (63.6 kg)   SpO2 98%   BMI 24.07 kg/m  Gen: NAD, resting comfortably HEENT: Mucous membranes are moist. Oropharynx normal Neck: no thyromegaly CV: RRR no murmurs rubs or gallops Lungs: CTAB no crackles, wheeze, rhonchi Abdomen: soft/nontender/nondistended/normal bowel sounds. No rebound or guarding.  Ext: no edema Skin: warm, dry Neuro: grossly normal, moves all extremities, PERRLA Rectal declined- wants to see GI   Assessment and Plan   29 y.o. female presenting for annual physical.  Health Maintenance counseling: 1. Anticipatory guidance: Patient counseled regarding regular dental exams q6 months- appointment in 2 weeks, eye exams- yearly,  avoiding smoking and second hand smoke, limiting alcohol to 1 beverage per day.   2. Risk factor reduction:  Advised patient of need for regular exercise and diet rich and fruits and vegetables to reduce risk of heart attack and stroke. Exercise- 3-4x a week in April/may then went to 26 in June nad hasnt done as well. recommend 150 minutes a week exercise. Diet- reasonably healthy.  Wt Readings from Last 3 Encounters:  10/09/20 140 lb 3.2 oz (63.6 kg)  03/28/18 148 lb 6.4 oz (67.3 kg)  03/23/17 147 lb 9.6 oz (67 kg)  3. Immunizations/screenings/ancillary  studies- declines flu shot. Declines covid 19 vaccine.  Immunization History  Administered Date(s) Administered  . Influenza,inj,Quad PF,6+ Mos 08/17/2017  . Influenza-Unspecified 09/14/2015  . Tdap 09/17/2017  4. Cervical cancer screening- had in September- she will sign ROI for records.  5. Breast cancer screening-  breast exam with GYN and mammogram - also had mammogram- had 2 have repeat due to dense breasts. Mom with breast cancer and died of this.  6. Colon cancer screening - typically would recommend 45 but with GI issues we will get GI recs on this 7. Skin cancer screening- planning to see dermatology. advised regular  sunscreen use. Denies worrisome, changing, or new skin lesions.  8. Birth control/STD check- paragard IUD and monogamous with now husband (2 weeks ago). Wants copper tested as result 9. Osteoporosis screening at 71- will plan on this -Never smoker  Status of chronic or acute concerns   # GI Concerns/lower abdominal pain S:Patient mentioned that she has been having issues with her GI for the last year. did better initially after cholecystectomy 2019.   She is trying to eat a healthy diet but.  She mentioned that there are times where she is constipated for a week and then when she has a bowel movement its never a solid stool its always diarrhea- combo of liquid and loose stools. She also has been having some rectal pain recently- gets a sharp pain and gets a "bubble of pressure". Does not end up having a bowel movement.   She gets lower abdominal pain and when finally has bowel movement whether with constipation or diarrhea it relieves it. Some dark stools but no clear melena- like a rusty orange color and not eating carrots- occasionally green. Some intentional weight loss- down 8 lbs. Has urgency for bowel movements and sometimes causes food avoidance. Has eliminated milk- slight help.   Has had similar more mild issues in the past- usually in past if ate healthy symptoms would improve but not at present.   No family history of colon cancer A/P: patient with lower abdominal pain relieved by bowel movements and alternating constipation and diarrhea. This has been more significant over the last year. Sounds like IBS but would like to get GI opinion- referral placed today. Also update bloodwork.  -discussed trial low fodmap diet   Recommended follow up: Return in about 1 year (around 10/09/2021) for physical or sooner if needed.  Lab/Order associations: fasting   ICD-10-CM   1. Encounter for general adult medical examination with abnormal findings  Z00.01 CBC With Differential/Platelet     COMPLETE METABOLIC PANEL WITH GFR    Lipid Panel w/reflex Direct LDL    Copper, serum    Hepatitis C antibody    HIV Antibody (routine testing w rflx)  2. Screening for hyperlipidemia  Z13.220 Lipid Panel w/reflex Direct LDL  3. Lower abdominal pain  R10.30 CBC With Differential/Platelet    COMPLETE METABOLIC PANEL WITH GFR  4. Screening for HIV (human immunodeficiency virus)  Z11.4 HIV Antibody (routine testing w rflx)  5. Screening for hepatitis C declined  Z53.20   6. Encounter for hepatitis C screening test for low risk patient  Z11.59 Hepatitis C antibody  7. High risk medication use  Z79.899 Copper, serum  8. Rectal pain  K62.89 Ambulatory referral to Gastroenterology  9. Diarrhea, unspecified type  R19.7 Ambulatory referral to Gastroenterology  10. Constipation, unspecified constipation type  K59.00 Ambulatory referral to Gastroenterology    No orders of the defined types were  placed in this encounter.   Return precautions advised.  Garret Reddish, MD

## 2020-10-09 ENCOUNTER — Other Ambulatory Visit: Payer: Self-pay

## 2020-10-09 ENCOUNTER — Encounter: Payer: Self-pay | Admitting: Family Medicine

## 2020-10-09 ENCOUNTER — Ambulatory Visit (INDEPENDENT_AMBULATORY_CARE_PROVIDER_SITE_OTHER): Payer: 59 | Admitting: Family Medicine

## 2020-10-09 VITALS — BP 118/80 | HR 84 | Temp 98.2°F | Resp 18 | Ht 64.0 in | Wt 140.2 lb

## 2020-10-09 DIAGNOSIS — Z79899 Other long term (current) drug therapy: Secondary | ICD-10-CM

## 2020-10-09 DIAGNOSIS — Z13220 Encounter for screening for lipoid disorders: Secondary | ICD-10-CM | POA: Diagnosis not present

## 2020-10-09 DIAGNOSIS — Z5320 Procedure and treatment not carried out because of patient's decision for unspecified reasons: Secondary | ICD-10-CM

## 2020-10-09 DIAGNOSIS — Z1159 Encounter for screening for other viral diseases: Secondary | ICD-10-CM

## 2020-10-09 DIAGNOSIS — R1030 Lower abdominal pain, unspecified: Secondary | ICD-10-CM

## 2020-10-09 DIAGNOSIS — K5900 Constipation, unspecified: Secondary | ICD-10-CM

## 2020-10-09 DIAGNOSIS — Z114 Encounter for screening for human immunodeficiency virus [HIV]: Secondary | ICD-10-CM

## 2020-10-09 DIAGNOSIS — Z0001 Encounter for general adult medical examination with abnormal findings: Secondary | ICD-10-CM | POA: Diagnosis not present

## 2020-10-09 DIAGNOSIS — K6289 Other specified diseases of anus and rectum: Secondary | ICD-10-CM

## 2020-10-09 DIAGNOSIS — R197 Diarrhea, unspecified: Secondary | ICD-10-CM

## 2020-10-11 LAB — COMPLETE METABOLIC PANEL WITH GFR
AG Ratio: 2 (calc) (ref 1.0–2.5)
ALT: 11 U/L (ref 6–29)
AST: 15 U/L (ref 10–30)
Albumin: 4.5 g/dL (ref 3.6–5.1)
Alkaline phosphatase (APISO): 28 U/L — ABNORMAL LOW (ref 31–125)
BUN: 13 mg/dL (ref 7–25)
CO2: 23 mmol/L (ref 20–32)
Calcium: 9.2 mg/dL (ref 8.6–10.2)
Chloride: 107 mmol/L (ref 98–110)
Creat: 0.62 mg/dL (ref 0.50–1.10)
GFR, Est African American: 140 mL/min/{1.73_m2} (ref >=60)
GFR, Est Non African American: 121 mL/min/{1.73_m2} (ref >=60)
Globulin: 2.3 g/dL (calc) (ref 1.9–3.7)
Glucose, Bld: 90 mg/dL (ref 65–99)
Potassium: 3.9 mmol/L (ref 3.5–5.3)
Sodium: 140 mmol/L (ref 135–146)
Total Bilirubin: 0.6 mg/dL (ref 0.2–1.2)
Total Protein: 6.8 g/dL (ref 6.1–8.1)

## 2020-10-11 LAB — CBC WITH DIFFERENTIAL/PLATELET
Absolute Monocytes: 400 cells/uL (ref 200–950)
Basophils Absolute: 40 cells/uL (ref 0–200)
Basophils Relative: 1 %
Eosinophils Absolute: 152 cells/uL (ref 15–500)
Eosinophils Relative: 3.8 %
HCT: 36.3 % (ref 35.0–45.0)
Hemoglobin: 12 g/dL (ref 11.7–15.5)
Lymphs Abs: 1160 cells/uL (ref 850–3900)
MCH: 30 pg (ref 27.0–33.0)
MCHC: 33.1 g/dL (ref 32.0–36.0)
MCV: 90.8 fL (ref 80.0–100.0)
MPV: 10.3 fL (ref 7.5–12.5)
Monocytes Relative: 10 %
Neutro Abs: 2248 cells/uL (ref 1500–7800)
Neutrophils Relative %: 56.2 %
Platelets: 179 10*3/uL (ref 140–400)
RBC: 4 10*6/uL (ref 3.80–5.10)
RDW: 11.9 % (ref 11.0–15.0)
Total Lymphocyte: 29 %
WBC: 4 10*3/uL (ref 3.8–10.8)

## 2020-10-11 LAB — HEPATITIS C ANTIBODY
Hepatitis C Ab: NONREACTIVE
SIGNAL TO CUT-OFF: 0.01 (ref <1.00)

## 2020-10-11 LAB — LIPID PANEL W/REFLEX DIRECT LDL
Cholesterol: 166 mg/dL (ref <200)
HDL: 57 mg/dL (ref >=50)
LDL Cholesterol (Calc): 92 mg/dL (calc)
Non-HDL Cholesterol (Calc): 109 mg/dL (calc) (ref <130)
Total CHOL/HDL Ratio: 2.9 (calc) (ref <5.0)
Triglycerides: 76 mg/dL (ref <150)

## 2020-10-11 LAB — COPPER, SERUM: Copper: 81 ug/dL (ref 70–175)

## 2020-10-11 LAB — HIV ANTIBODY (ROUTINE TESTING W REFLEX): HIV 1&2 Ab, 4th Generation: NONREACTIVE

## 2020-10-31 ENCOUNTER — Encounter: Payer: Self-pay | Admitting: Gastroenterology

## 2020-12-13 ENCOUNTER — Ambulatory Visit: Payer: 59 | Admitting: Gastroenterology

## 2021-02-07 ENCOUNTER — Ambulatory Visit: Payer: Self-pay | Attending: Internal Medicine

## 2021-02-07 DIAGNOSIS — Z20822 Contact with and (suspected) exposure to covid-19: Secondary | ICD-10-CM | POA: Insufficient documentation

## 2021-02-08 LAB — SARS-COV-2, NAA 2 DAY TAT

## 2021-02-08 LAB — NOVEL CORONAVIRUS, NAA: SARS-CoV-2, NAA: NOT DETECTED

## 2021-05-02 ENCOUNTER — Encounter: Payer: Self-pay | Admitting: Gastroenterology

## 2021-05-02 ENCOUNTER — Other Ambulatory Visit (INDEPENDENT_AMBULATORY_CARE_PROVIDER_SITE_OTHER): Payer: Self-pay

## 2021-05-02 ENCOUNTER — Ambulatory Visit: Payer: Self-pay | Admitting: Gastroenterology

## 2021-05-02 VITALS — BP 108/80 | HR 93 | Ht 64.0 in | Wt 152.1 lb

## 2021-05-02 DIAGNOSIS — K6289 Other specified diseases of anus and rectum: Secondary | ICD-10-CM

## 2021-05-02 DIAGNOSIS — R194 Change in bowel habit: Secondary | ICD-10-CM

## 2021-05-02 DIAGNOSIS — R1084 Generalized abdominal pain: Secondary | ICD-10-CM

## 2021-05-02 DIAGNOSIS — R1011 Right upper quadrant pain: Secondary | ICD-10-CM

## 2021-05-02 LAB — COMPREHENSIVE METABOLIC PANEL
ALT: 33 U/L (ref 0–35)
AST: 21 U/L (ref 0–37)
Albumin: 4.9 g/dL (ref 3.5–5.2)
Alkaline Phosphatase: 35 U/L — ABNORMAL LOW (ref 39–117)
BUN: 11 mg/dL (ref 6–23)
CO2: 25 mEq/L (ref 19–32)
Calcium: 9.6 mg/dL (ref 8.4–10.5)
Chloride: 105 mEq/L (ref 96–112)
Creatinine, Ser: 0.72 mg/dL (ref 0.40–1.20)
GFR: 111.44 mL/min (ref >60.00)
Glucose, Bld: 87 mg/dL (ref 70–99)
Potassium: 3.7 mEq/L (ref 3.5–5.1)
Sodium: 140 mEq/L (ref 135–145)
Total Bilirubin: 0.4 mg/dL (ref 0.2–1.2)
Total Protein: 8.2 g/dL (ref 6.0–8.3)

## 2021-05-02 LAB — CBC
HCT: 40.7 % (ref 36.0–46.0)
Hemoglobin: 13.8 g/dL (ref 12.0–15.0)
MCHC: 33.9 g/dL (ref 30.0–36.0)
MCV: 86.7 fl (ref 78.0–100.0)
Platelets: 274 10*3/uL (ref 150.0–400.0)
RBC: 4.69 Mil/uL (ref 3.87–5.11)
RDW: 13 % (ref 11.5–15.5)
WBC: 5.6 10*3/uL (ref 4.0–10.5)

## 2021-05-02 LAB — TSH: TSH: 1.67 u[IU]/mL (ref 0.35–4.50)

## 2021-05-02 NOTE — Progress Notes (Signed)
05/02/2021 Susan Raymond [id number] 19-Feb-1991   HISTORY OF PRESENT ILLNESS: This is a 30 year old female who is new to our office.  She has been referred here by her PCP, Dr. Durene Cal, for evaluation regarding rectal pain and altered bowel habits with constipation alternating with diarrhea.  Patient tells me that she is now living in the [place].  She got married to a [place] in October 2021 and is now living there with him.  She comes back here periodically for medical care as [place] [address] with very limited medical resources.  She is here today with several different complaints.  She reports very intermittent and random rectal pain.  She says that it will just come on out of nowhere and is very sharp type pain.  She said for the past year she has been having very consistent bowel movements with constipation alternating with diarrhea.  Says sometimes she goes 3 to 5 days without a bowel movement and then will have either hard, pellet-like stools or diarrhea.  She also reports right upper quadrant abdominal pain.  She had her gallbladder removed 3 years ago.  She denies any rectal bleeding.   Past Medical History:  Diagnosis Date   Allergic rhinitis    Arthritis    R knee- seen ortho   Family history of breast cancer    mother died at 42. yearly mammograms starting 24 through GYN   Gallstones    Kidney stone    2013, 2014 x2- passed without intervention   Past Surgical History:  Procedure Laterality Date   CHOLECYSTECTOMY     2019    reports that she has never smoked. She has never used smokeless tobacco. She reports current alcohol use. She reports that she does not use drugs. family history includes Breast cancer in her maternal grandmother, mother, and paternal great-grandmother; Heart disease in her father; Hyperlipidemia in her father; Hypertension in her father; Suicidality in her father. No Known Allergies    Outpatient Encounter Medications as of  05/02/2021  Medication Sig   IBUPROFEN PO Take by mouth as needed.   paragard intrauterine copper IUD IUD ParaGard T 380A 380 square mm intrauterine device  Take 1 device by intrauterine route.   No facility-administered encounter medications on file as of 05/02/2021.    REVIEW OF SYSTEMS  : All other systems reviewed and negative except where noted in the History of Present Illness.   PHYSICAL EXAM: BP 108/80 (BP Location: Left Arm, Patient Position: Sitting, Cuff Size: Normal)   Pulse 93   Ht 5\' 4"  (1.626 m)   Wt 152 lb 2 oz (69 kg)   SpO2 99%   BMI 26.11 kg/m  General: Well developed white female in no acute distress Head: Normocephalic and atraumatic Eyes:  Sclerae anicteric, conjunctiva pink. Ears: Normal auditory acuity Lungs: Clear throughout to auscultation; no W/R/R. Heart: Regular rate and rhythm; no M/R/G. Abdomen: Soft, non-distended.  BS present.  Mild diffuse TTP, more in the RUQ. Rectal:  No external abnormalities noted.  DRE did not reveal any pain or masses.  Tampon would be felt anteriorly.   Musculoskeletal: Symmetrical with no gross deformities  Skin: No lesions on visible extremities Extremities: No edema  Neurological: Alert oriented x 4, grossly non-focal. Psychological:  Alert and cooperative. Normal mood and affect  ASSESSMENT AND PLAN: *Change in bowel habits: Over the last year she has inconsistent stool, constipation alternating with diarrhea.  We discussed the use of  a daily powder fiber supplement such as Benefiber beginning with 2 teaspoons mixed in 8 ounces of liquid daily.  That can be increased to twice daily if needed/tolerated and/or she could try MiraLAX daily, 1 capful mixed in 8 ounces of liquid. *Rectal pain:  No bleeding.  Nothing felt or seen on exam today.  Pain is intermittent and random.  Story sounds c/w proctalgia fugax.   *Right upper quadrant abdominal pain: She is status post cholecystectomy.  She is asking about an abdominal  ultrasound.  I think we can proceed with right upper quadrant ultrasound.  We will also check CBC, CMP, TSH, and celiac labs in light of her other symptoms.  **We discussed colonoscopy, however patient is self-pay patient as she is now living in the [place].  She would like to hold off for now.   CC:  Shelva Majestic, MD

## 2021-05-02 NOTE — Patient Instructions (Signed)
It was a pleasure to meet you today. Based on our discussion, I am providing you with my recommendations below:  RECOMMENDATION(S):   Consider Miralax daily vs Benefiber. Dissolve 2 teaspoons in 8 ounces of water or juice daily  LABS:   Please proceed to the basement level for lab work before leaving today. Press "B" on the elevator. The lab is located at the first door on the left as you exit the elevator.  HEALTHCARE LAWS AND MY CHART RESULTS:   Due to recent changes in healthcare laws, you may see results of your imaging and/or laboratory studies on MyChart before I have had a chance to review them.  I understand that in some cases there may be results that are confusing or concerning to you. Please understand that not all results are received at same time and often I may need to interpret multiple results in order to provide you with the best plan of care or course of treatment. Therefore, I ask that you please give me 48 hours to thoroughly review all your results before contacting my office for clarification.   ABDOMINAL ULTRASOUND:  You have been scheduled for an abdominal ultrasound at [address] on 05/09/21 at 11:30AM. Please arrive @ 11:15AM for registration.   PREP:   Please DO NOT eat or drink anything 6 hours prior to your test.  NEED TO RESCHEDULE?   Please call radiology at [phone].  BMI:  If you are age 45 or younger, your body mass index should be between 19-25. Your There is no height or weight on file to calculate BMI. If this is out of the aformentioned range listed, please consider follow up with your Primary Care Provider.   MY CHART:  The [facility] providers would like to encourage you to use [facility] to communicate with providers for non-urgent requests or questions.  Due to long hold times on the telephone, sending your provider a message by [facility] may be a faster and more efficient way to get a response.  Please allow 48 business hours for a  response.  Please remember that this is for non-urgent requests.   Thank you for trusting me with your gastrointestinal care!    Doug Sou, PA-C

## 2021-05-05 LAB — TISSUE TRANSGLUTAMINASE, IGA: (tTG) Ab, IgA: <1 U/mL

## 2021-05-05 LAB — IGA: Immunoglobulin A: 248 mg/dL (ref 47–310)

## 2021-05-09 ENCOUNTER — Ambulatory Visit (HOSPITAL_BASED_OUTPATIENT_CLINIC_OR_DEPARTMENT_OTHER)
Admission: RE | Admit: 2021-05-09 | Discharge: 2021-05-09 | Disposition: A | Discharge status: Home / Self Care | Payer: Self-pay | Source: Ambulatory Visit | Attending: Gastroenterology | Admitting: Gastroenterology

## 2021-05-09 ENCOUNTER — Other Ambulatory Visit: Payer: Self-pay

## 2021-05-09 DIAGNOSIS — R1084 Generalized abdominal pain: Secondary | ICD-10-CM | POA: Insufficient documentation

## 2021-05-09 DIAGNOSIS — R1011 Right upper quadrant pain: Secondary | ICD-10-CM | POA: Insufficient documentation

## 2021-05-09 DIAGNOSIS — K6289 Other specified diseases of anus and rectum: Secondary | ICD-10-CM | POA: Insufficient documentation

## 2021-05-09 DIAGNOSIS — R194 Change in bowel habit: Secondary | ICD-10-CM | POA: Insufficient documentation

## 2021-05-12 ENCOUNTER — Encounter: Payer: Self-pay | Admitting: Gastroenterology

## 2021-05-12 DIAGNOSIS — K6289 Other specified diseases of anus and rectum: Secondary | ICD-10-CM | POA: Insufficient documentation

## 2021-05-12 DIAGNOSIS — R1011 Right upper quadrant pain: Secondary | ICD-10-CM | POA: Insufficient documentation

## 2021-05-12 DIAGNOSIS — R194 Change in bowel habit: Secondary | ICD-10-CM | POA: Insufficient documentation

## 2021-05-12 DIAGNOSIS — R1084 Generalized abdominal pain: Secondary | ICD-10-CM | POA: Insufficient documentation

## 2021-05-13 NOTE — Progress Notes (Signed)
Attending Physician's Attestation   I have reviewed the chart.   I agree with the Advanced Practitioner's note, impression, and recommendations with any updates as below.    Krishan Mcbreen Mansouraty, MD [facility] Advanced [facility]oscopy Office # [phone]  

## 2021-05-16 ENCOUNTER — Other Ambulatory Visit: Payer: Self-pay

## 2021-10-08 NOTE — Progress Notes (Incomplete)
Phone [phone]   Subjective:  Patient presents today for their annual physical. Chief complaint-noted.   See problem oriented charting- ROS- full  review of systems was completed and negative except for: ***  The following were reviewed and entered/updated in epic: Past Medical History:  Diagnosis Date   Allergic rhinitis    Arthritis    R knee- seen ortho   Family history of breast cancer    mother died at 20. yearly mammograms starting 24 through GYN   Gallstones    Kidney stone    2013, 2014 x2- passed without intervention   Patient Active Problem List   Diagnosis Date Noted   Altered bowel habits 05/12/2021   Rectal pain 05/12/2021   Generalized abdominal pain 05/12/2021   RUQ abdominal pain 05/12/2021   Gallstones    Allergic rhinitis    Family history of breast cancer    Past Surgical History:  Procedure Laterality Date   CHOLECYSTECTOMY     2019    Family History  Problem Relation Age of Onset   Breast cancer Mother        died 67   Heart disease Father    Suicidality Father        father in 38s after wife died   Hyperlipidemia Father    Hypertension Father    Breast cancer Maternal Grandmother    Breast cancer Paternal Great-grandmother    Colon cancer Neg Hx    Esophageal cancer Neg Hx    Pancreatic cancer Neg Hx    Stomach cancer Neg Hx     Medications- reviewed and updated Current Outpatient Medications  Medication Sig Dispense Refill   IBUPROFEN PO Take by mouth as needed.     paragard intrauterine copper IUD IUD ParaGard T 380A 380 square mm intrauterine device  Take 1 device by intrauterine route.     No current facility-administered medications for this visit.    Allergies-reviewed and updated No Known Allergies  Social History   Social History Narrative   Family: Married to [place] boyfriend in 2021- planning to move there with him.       Work: Warehouse manager. Worked as Engineer, site at surgical center.     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    Objective  Objective:  There were no vitals taken for this visit. Gen: NAD, resting comfortably HEENT: Mucous membranes are moist. Oropharynx normal Neck: no thyromegaly CV: RRR no murmurs rubs or gallops Lungs: CTAB no crackles, wheeze, rhonchi Abdomen: soft/nontender/nondistended/normal bowel sounds. No rebound or guarding.  Ext: no edema Skin: warm, dry Neuro: grossly normal, moves all extremities, PERRLA***   Assessment and Plan   30 y.o. female presenting for annual physical.  Health Maintenance counseling: 1. Anticipatory guidance: Patient counseled regarding regular dental exams ***q6 months, eye exams ***,  avoiding smoking and second hand smoke*** , limiting alcohol to 1 beverage per day*** .No illicit drugs - ***   2. Risk factor reduction:  Advised patient of need for regular exercise and diet rich and fruits and vegetables to reduce risk of heart attack and stroke. Exercise- ***. Diet-***.  Wt Readings from Last 3 Encounters:  05/02/21 152 lb 2 oz (69 kg)  10/09/20 140 lb 3.2 oz (63.6 kg)  03/28/18 148 lb 6.4 oz (67.3 kg)   3. Immunizations/screenings/ancillary studies DISCUSSED:  -Flu vaccination (last one 09/20) - *** -Prevnar-20 vaccination #1 - *** -COVID booster vaccination #1 - *** Immunization History  Administered Date(s)  Administered   Influenza,inj,Quad PF,6+ Mos 08/17/2017   Influenza-Unspecified 09/14/2015   Tdap 09/17/2017   Health Maintenance Due  Topic Date Due   COVID-19 Vaccine (1) Never done   Pneumococcal Vaccine 68-93 Years old (1 - PCV) Never done   INFLUENZA VACCINE  06/23/2021   4. Cervical cancer screening- pap smear 07/31/20 with 3 year repeat planned*** 5. Breast cancer screening-  breast exam *** and mammogram *** 6. Colon cancer screening - *** 7. Skin cancer screening- ***advised regular sunscreen use. Denies worrisome, changing, or new skin lesions.  8. Birth control/STD  check- *** 9. Osteoporosis screening at 72- *** -*** smoker  Status of chronic or acute concerns   #Gallstones S:  usually once a year would have attack from gallbladder pain over last 4 years since 2019. She reported on 05/19 visit, that over last 12 days he had felt 3 attacks- last one being on easter. Described sharp intense pain - usually woke her up early in the morning- pain up to 8-9/10 and lasted about 3-4 hours. Took tylenol and advil 2 of each and tried to rest- but difficult. No nausea or vomiting. No fever with this.    Last year referred to surgery- made the appointment then cancelled as feeling better.  Patient stated she was ready to put this behind her and have surgery.  IMPRESSION: 2.2 cm gallstone.  Mildly positive Murphy sign by ultrasound."  -We referred to genral surgery and she wanted to pursue cholecystectomy  -She was going out of the country for 12 days to the [place] stated she definitely wanted to go on this trip.   A/P: ***   Health Maintenance Due  Topic Date Due   COVID-19 Vaccine (1) Never done   Pneumococcal Vaccine 74-76 Years old (1 - PCV) Never done   INFLUENZA VACCINE  06/23/2021   Recommended follow up: No follow-ups on file. Future Appointments  Date Time Provider Department Center  10/10/2021  8:00 AM Shelva Majestic, MD [facility]    No chief complaint on file.  Lab/Order associations:*** fasting No diagnosis found.  No orders of the defined types were placed in this encounter.   Return precautions advised.  Scheryl Marten

## 2021-10-10 ENCOUNTER — Encounter: Payer: Self-pay | Admitting: Family Medicine

## 2021-10-10 DIAGNOSIS — K8020 Calculus of gallbladder without cholecystitis without obstruction: Secondary | ICD-10-CM

## 2021-10-10 DIAGNOSIS — Z0000 Encounter for general adult medical examination without abnormal findings: Secondary | ICD-10-CM

## 2021-10-10 DIAGNOSIS — Z23 Encounter for immunization: Secondary | ICD-10-CM

## 2021-10-26 ENCOUNTER — Encounter: Payer: Self-pay | Admitting: Family Medicine

## 2021-12-22 IMAGING — US US ABDOMEN LIMITED
1 series · 14 of 25 positions shown · non-contrast
Comparison: Abdominal ultrasound dated 04/05/2017.

CLINICAL DATA: Right upper quadrant pain for 1 year. History of
cholecystectomy in 1811.

EXAM:
ULTRASOUND ABDOMEN LIMITED RIGHT UPPER QUADRANT

[Series 1: us abdomen limited ruq (liver/gb) · 14 of 58 slices shown]
[im 1/58]
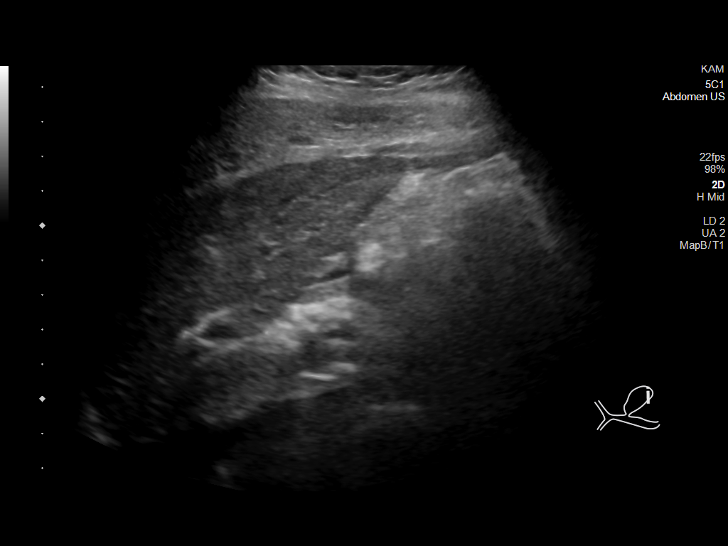
[im 5/58]
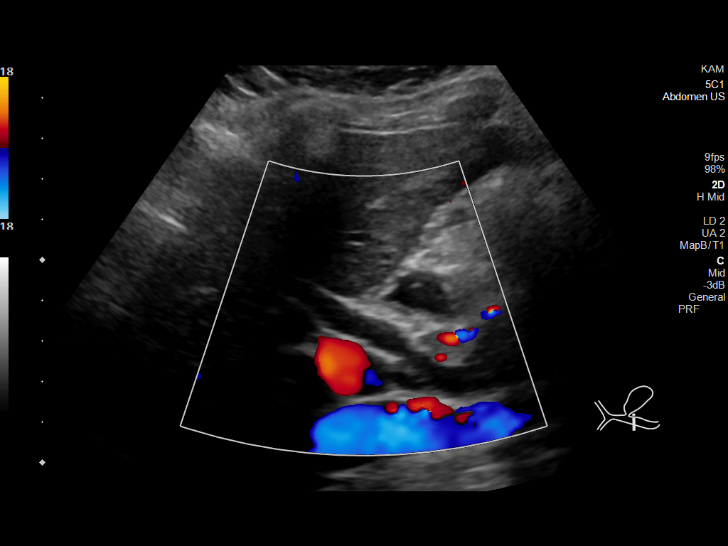
[im 10/58]
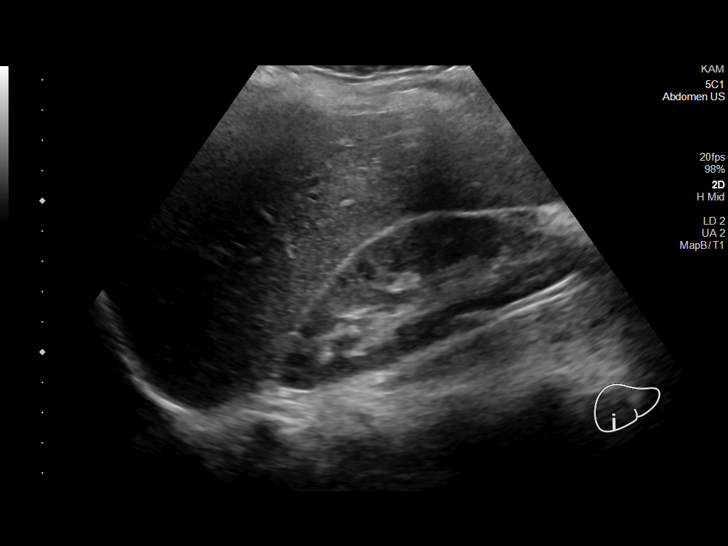
[im 15/58]
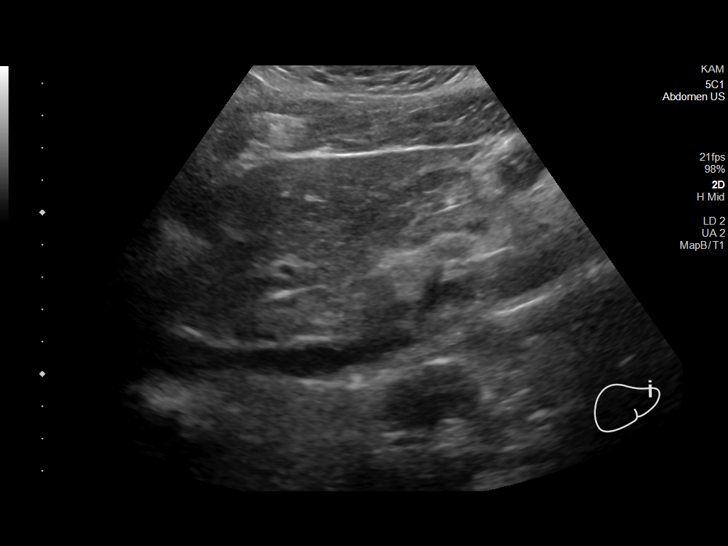
[im 20/58]
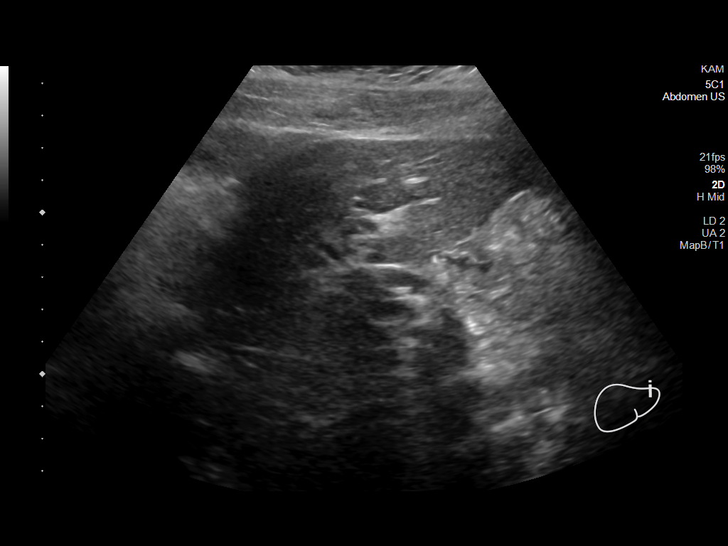
[im 22/58]
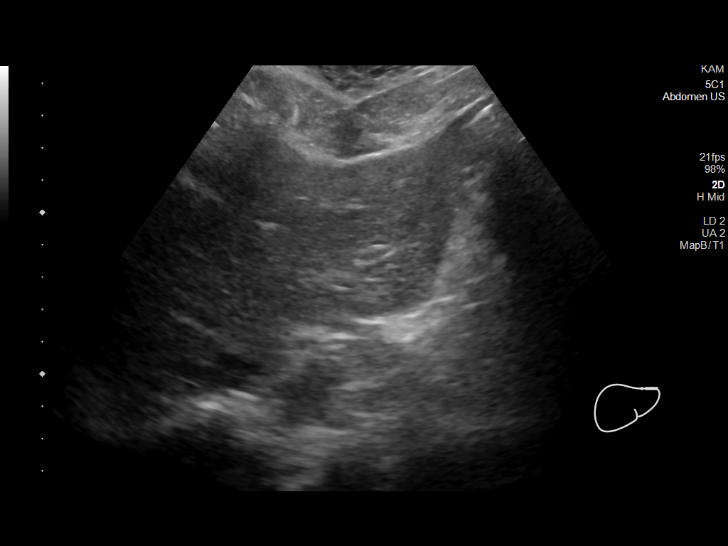
[im 27/58]
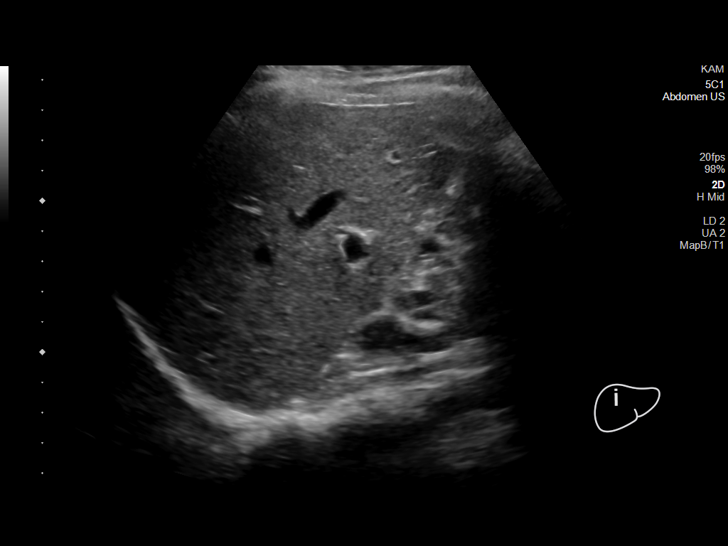
[im 31/58]
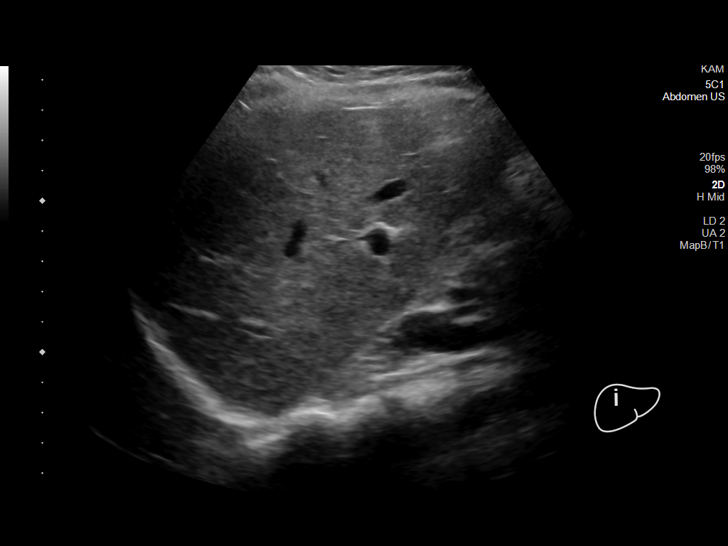
[im 36/58]
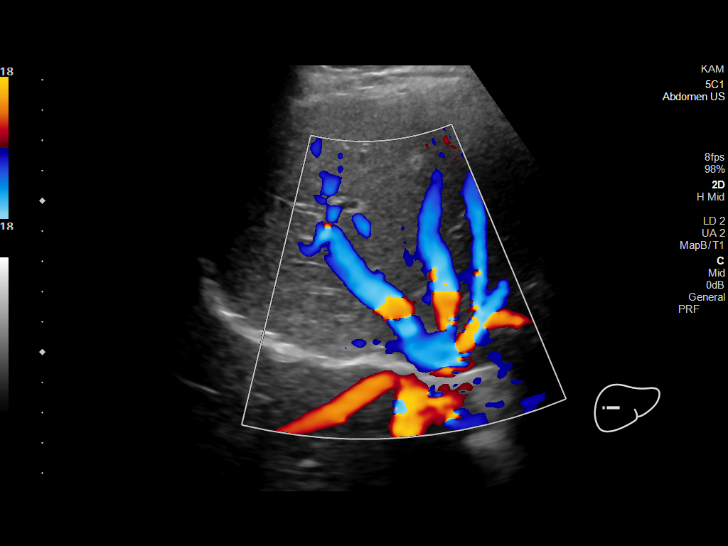
[im 39/58]
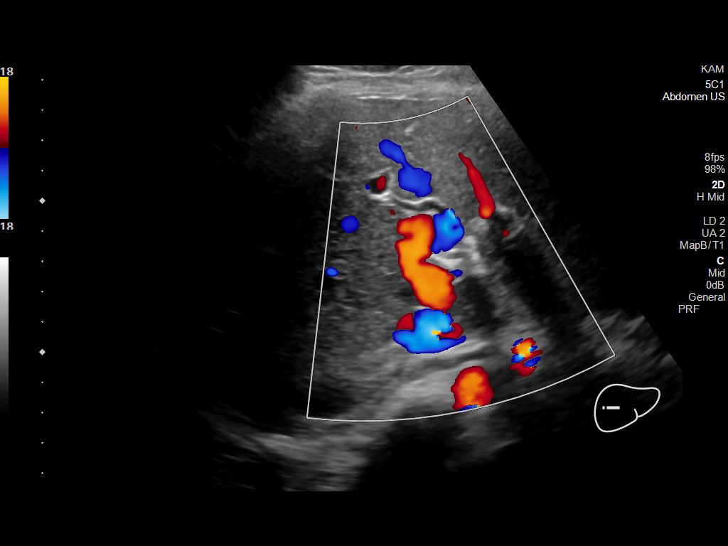
[im 43/58]
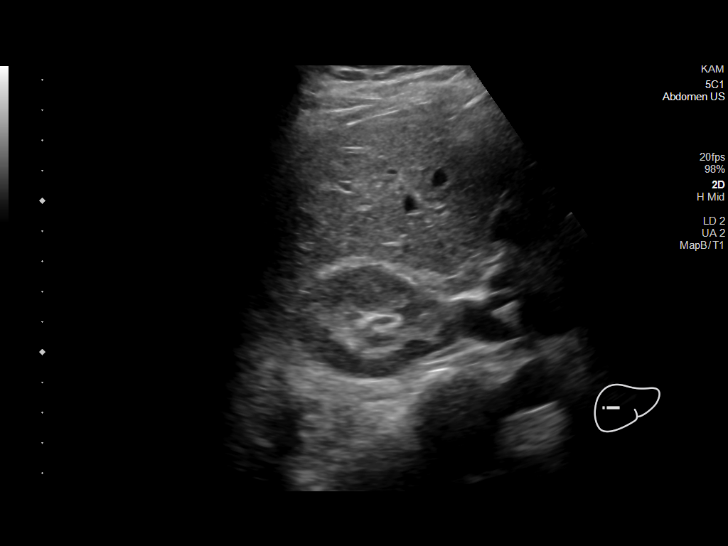
[im 48/58]
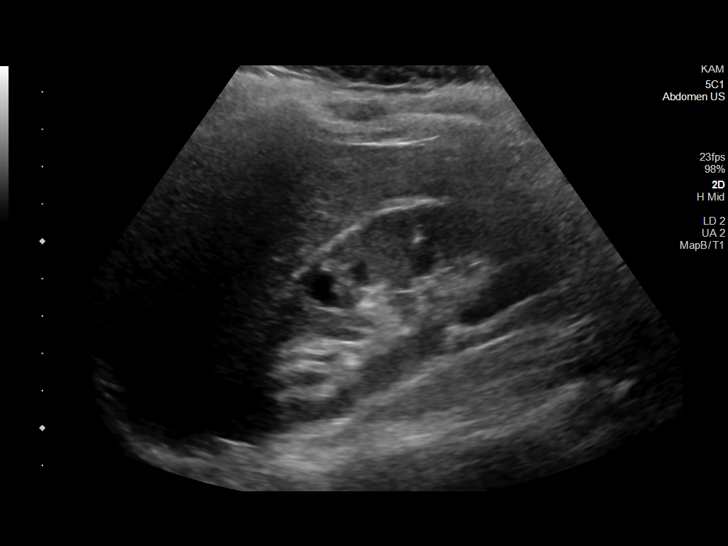
[im 53/58]
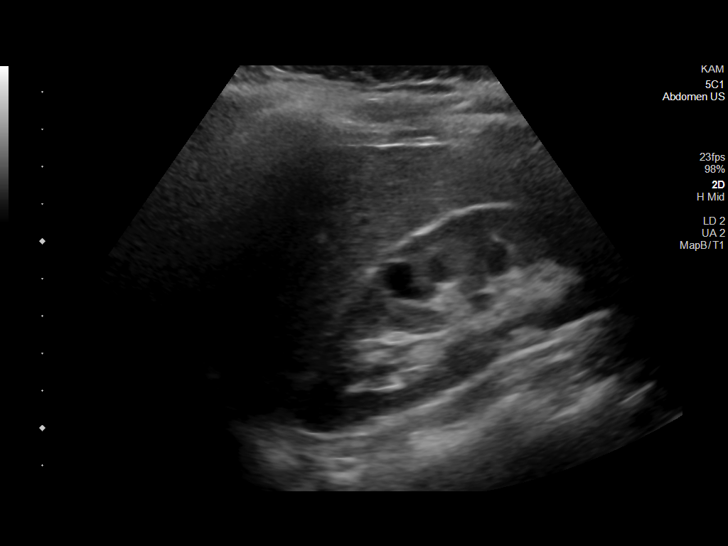
[im 58/58]
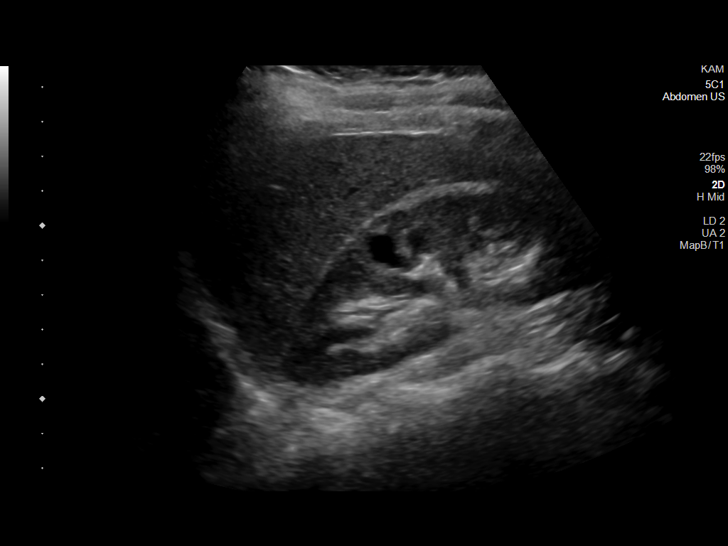

[14 of 25 positions shown; findings below may reference images not displayed]

FINDINGS: Gallbladder:

Surgically absent.  No sonographic Murphy sign noted by sonographer.

Common bile duct:

Diameter: 6 mm

Liver:

No focal lesion identified. Within normal limits in parenchymal
echogenicity. Portal vein is patent on color Doppler imaging with
normal direction of blood flow towards the liver.

Other: A cyst in the right kidney measures 1.4 x 0.9 x 1.1 cm.
IMPRESSION: No findings to explain the patient's symptoms.

## 2022-01-16 ENCOUNTER — Encounter: Payer: Self-pay | Admitting: Family Medicine

## 2022-08-17 ENCOUNTER — Encounter: Payer: Self-pay | Admitting: *Deleted

## 2022-11-05 ENCOUNTER — Encounter: Payer: Self-pay | Admitting: *Deleted

## 2023-09-02 ENCOUNTER — Ambulatory Visit (INDEPENDENT_AMBULATORY_CARE_PROVIDER_SITE_OTHER): Payer: Self-pay | Admitting: Family Medicine

## 2023-09-02 ENCOUNTER — Other Ambulatory Visit (INDEPENDENT_AMBULATORY_CARE_PROVIDER_SITE_OTHER): Payer: Self-pay

## 2023-09-02 VITALS — BP 138/82 | HR 90 | Temp 98.5°F | Ht 65.0 in | Wt 160.0 lb

## 2023-09-02 DIAGNOSIS — Z7689 Persons encountering health services in other specified circumstances: Secondary | ICD-10-CM

## 2023-09-02 DIAGNOSIS — Z0000 Encounter for general adult medical examination without abnormal findings: Secondary | ICD-10-CM

## 2023-09-02 DIAGNOSIS — Z131 Encounter for screening for diabetes mellitus: Secondary | ICD-10-CM

## 2023-09-02 DIAGNOSIS — Z803 Family history of malignant neoplasm of breast: Secondary | ICD-10-CM

## 2023-09-02 DIAGNOSIS — Z1231 Encounter for screening mammogram for malignant neoplasm of breast: Secondary | ICD-10-CM

## 2023-09-02 DIAGNOSIS — Z1389 Encounter for screening for other disorder: Secondary | ICD-10-CM

## 2023-09-02 LAB — COMPREHENSIVE METABOLIC PANEL WITH GFR
ALT: 12 U/L (ref 0–35)
AST: 15 U/L (ref 0–37)
Albumin: 4.6 g/dL (ref 3.5–5.2)
Alkaline Phosphatase: 35 U/L — ABNORMAL LOW (ref 39–117)
BUN: 13 mg/dL (ref 6–23)
CO2: 24 meq/L (ref 19–32)
Calcium: 9.7 mg/dL (ref 8.4–10.5)
Chloride: 103 meq/L (ref 96–112)
Creatinine, Ser: 0.65 mg/dL (ref 0.40–1.20)
GFR: 115.44 mL/min
Glucose, Bld: 92 mg/dL (ref 70–99)
Potassium: 3.6 meq/L (ref 3.5–5.1)
Sodium: 137 meq/L (ref 135–145)
Total Bilirubin: 0.4 mg/dL (ref 0.2–1.2)
Total Protein: 7.2 g/dL (ref 6.0–8.3)

## 2023-09-02 LAB — CBC WITH DIFFERENTIAL/PLATELET
Basophils Absolute: 0 10*3/uL (ref 0.0–0.1)
Basophils Relative: 0.5 % (ref 0.0–3.0)
Eosinophils Absolute: 0.1 10*3/uL (ref 0.0–0.7)
Eosinophils Relative: 0.8 % (ref 0.0–5.0)
HCT: 39.6 % (ref 36.0–46.0)
Hemoglobin: 12.9 g/dL (ref 12.0–15.0)
Lymphocytes Relative: 17.5 % (ref 12.0–46.0)
Lymphs Abs: 1.4 10*3/uL (ref 0.7–4.0)
MCHC: 32.6 g/dL (ref 30.0–36.0)
MCV: 89.1 fl (ref 78.0–100.0)
Monocytes Absolute: 0.5 10*3/uL (ref 0.1–1.0)
Monocytes Relative: 6.6 % (ref 3.0–12.0)
Neutro Abs: 6 10*3/uL (ref 1.4–7.7)
Neutrophils Relative %: 74.6 % (ref 43.0–77.0)
Platelets: 236 10*3/uL (ref 150.0–400.0)
RBC: 4.44 Mil/uL (ref 3.87–5.11)
RDW: 13.7 % (ref 11.5–15.5)
WBC: 8 10*3/uL (ref 4.0–10.5)

## 2023-09-02 LAB — TSH: TSH: 1.49 u[IU]/mL (ref 0.35–5.50)

## 2023-09-02 LAB — POCT URINALYSIS DIPSTICK
Bilirubin, UA: NEGATIVE
Blood, UA: NEGATIVE
Glucose, UA: NEGATIVE
Ketones, UA: NEGATIVE
Leukocytes, UA: NEGATIVE
Nitrite, UA: NEGATIVE
Protein, UA: NEGATIVE
Spec Grav, UA: 1.015
Urobilinogen, UA: NEGATIVE U/dL — AB
pH, UA: 6.5

## 2023-09-02 LAB — HEMOGLOBIN A1C: Hgb A1c MFr Bld: 5.3 % (ref 4.6–6.5)

## 2023-09-02 NOTE — Progress Notes (Signed)
New Patient Office Visit  Subjective    Patient ID: Susan Raymond, female    DOB: [birth year]  Age: 32 y.o. MRN: [id number]  CC:  Chief Complaint  Patient presents with   Establish Care    Would like labs done.     HPI Susan Raymond presents to establish care with new provider .   Patients previous primary care provider was [facility] with Dr. Tana Conch.   Specialist: (Previously) [facility]  Patient has no concerns. She is visiting from the [place] and would like labs checked and urinalysis checked. Also, would like to see if she can get a mammogram while she is in the [place]. Patient has a family history of breast cancer. Last mammogram was in 2021.   Outpatient Encounter Medications as of 09/02/2023  Medication Sig   IBUPROFEN PO Take by mouth as needed.   [DISCONTINUED] paragard intrauterine copper IUD IUD ParaGard T 380A 380 square mm intrauterine device  Take 1 device by intrauterine route.   No facility-administered encounter medications on file as of 09/02/2023.    Past Medical History:  Diagnosis Date   Allergic rhinitis    Allergy    Anxiety    Arthritis    R knee- seen ortho   Depression    Family history of breast cancer    mother died at 31. yearly mammograms starting 24 through GYN   Gallstones    Kidney stone    2013, 2014 x2- passed without intervention    Past Surgical History:  Procedure Laterality Date   CHOLECYSTECTOMY     2019    Family History  Problem Relation Age of Onset   Breast cancer Mother        died 35   Cancer Mother    Early death Mother    Heart disease Father    Suicidality Father        father in 96s after wife died   Hyperlipidemia Father    Hypertension Father    Early death Father    Breast cancer Maternal Grandmother    Breast cancer Paternal Great-grandmother    Colon cancer Neg Hx    Esophageal cancer Neg Hx    Pancreatic cancer Neg Hx    Stomach cancer Neg Hx     Social  History   Socioeconomic History   Marital status: Married    Spouse name: Not on file   Number of children: 0   Years of education: Not on file   Highest education level: Bachelor's degree (e.g., BA, AB, BS)  Occupational History   Not on file  Tobacco Use   Smoking status: Never   Smokeless tobacco: Never  Vaping Use   Vaping status: Never Used  Substance and Sexual Activity   Alcohol use: Yes    Comment: One a week   Drug use: No   Sexual activity: Yes    Birth control/protection: Coitus interruptus  Other Topics Concern   Not on file  Social History Narrative   Family: Married to [place] boyfriend in 2021- planning to move there with him.       Work: Warehouse manager. Worked as Engineer, site at surgical center.    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    Social Determinants of Health   Financial Resource Strain: Low Risk  (09/02/2023)   Overall Financial Resource Strain (CARDIA)    Difficulty of Paying Living Expenses: Not very hard  Food Insecurity:  No Food Insecurity (09/02/2023)   Hunger Vital Sign    Worried About Running Out of Food in the Last Year: Never true    Ran Out of Food in the Last Year: Never true  Transportation Needs: No Transportation Needs (09/02/2023)   PRAPARE - Administrator, Civil Service (Medical): No    Lack of Transportation (Non-Medical): No  Physical Activity: Insufficiently Active (09/02/2023)   Exercise Vital Sign    Days of Exercise per Week: 3 days    Minutes of Exercise per Session: 30 min  Stress: No Stress Concern Present (09/02/2023)   [employer] of Occupational Health - Occupational Stress Questionnaire    Feeling of Stress : Only a little  Social Connections: Socially Integrated (09/02/2023)   Social Connection and Isolation Panel [NHANES]    Frequency of Communication with Friends and Family: More than three times a week    Frequency of Social Gatherings with Friends and  Family: Once a week    Attends Religious Services: More than 4 times per year    Active Member of [employer] or Organizations: Yes    Attends Engineer, structural: More than 4 times per year    Marital Status: Married  Catering manager Violence: Not At Risk (09/02/2023)   Humiliation, Afraid, Rape, and Kick questionnaire    Fear of Current or Ex-Partner: No    Emotionally Abused: No    Physically Abused: No    Sexually Abused: No    ROS See HPI above    Objective   BP 138/82 (BP Location: Right Arm, Patient Position: Sitting, Cuff Size: Normal)   Pulse 90   Temp 98.5 F (36.9 C) (Oral)   Ht 5\' 5"  (1.651 m)   Wt 160 lb (72.6 kg)   LMP 09/02/2023 (Approximate)   SpO2 98%   BMI 26.63 kg/m   Physical Exam Vitals reviewed.  Constitutional:      General: She is not in acute distress.    Appearance: Normal appearance. She is not ill-appearing, toxic-appearing or diaphoretic.  HENT:     Head: Normocephalic and atraumatic.  Eyes:     General:        Right eye: No discharge.        Left eye: No discharge.     Conjunctiva/sclera: Conjunctivae normal.  Cardiovascular:     Rate and Rhythm: Normal rate and regular rhythm.     Heart sounds: Normal heart sounds. No murmur heard.    No friction rub. No gallop.  Pulmonary:     Effort: Pulmonary effort is normal. No respiratory distress.     Breath sounds: Normal breath sounds.  Musculoskeletal:        General: Normal range of motion.  Skin:    General: Skin is warm and dry.  Neurological:     General: No focal deficit present.     Mental Status: She is alert and oriented to person, place, and time. Mental status is at baseline.  Psychiatric:        Mood and Affect: Mood normal.        Behavior: Behavior normal.        Thought Content: Thought content normal.        Judgment: Judgment normal.     Latest Reference Range & Units 09/02/23 14:22  Bilirubin, UA  neg  Clarity, UA  clear  Color, UA  yellow  Glucose Negative   Negative  Ketones, UA  neg  Leukocytes,UA Negative  Negative  Nitrite, UA  neg  pH, UA 5.0 - 8.0  6.5  Protein,UA Negative  Negative  Specific Gravity, UA 1.010 - 1.025  1.015  Urobilinogen, UA 0.2 or 1.0 E.U./dL negative !  RBC, UA  neg     Assessment & Plan:  Family history of breast cancer -     3D Screening Mammogram, Left and Right; Future  Encounter for screening mammogram for malignant neoplasm of breast -     3D Screening Mammogram, Left and Right; Future  Encounter to establish care -     CBC with Differential/Platelet; Future -     Comprehensive metabolic panel; Future -     TSH; Future -     Hemoglobin A1c; Future  Screening for blood or protein in urine -     POCT urinalysis dipstick   1.Review health maintenance: -Cervical Cancer Screening: Sept 2021 -Mammogram: Ordered, has family history of breast cancer -Covid vaccine: Declined -Influenza vaccine: Declined  2.Ordered screening labs (CBC with Diff, CMP, TSH, and A1c).  3.Ordered mammogram. If mammogram can not be scheduled while she is in the [place], advised to please call or send a MyChart message when she has an idea of when she will be back and will place another order.  Please go to the following location to have labs drawn [facility] or xray: Walk in 8:30-4:30 during weekdays, no appointment needed [address]  [place], [place] [address] 4.Urinalysis completed with no concerns.   Return in about 1 year (around 09/01/2024) for physical.   Zandra Abts, NP

## 2023-09-02 NOTE — Patient Instructions (Addendum)
-  It was a pleasure to meet you and look forward to taking care of you.  -Urinalysis completed. No concerns.  -Ordered screening labs (CBC with Diff, CMP, TSH, and A1c). Office will call with lab results and you will see results on Mychart.  -Ordered mammogram. If mammogram can not be scheduled while you are in the [place], please call or send a MyChart message when you have an idea of when you will be back and will place another order.  Please go to the following location to have labs drawn [facility] or xray: Walk in 8:30-4:30 during weekdays, no appointment needed [address]  [place], [place] [address]  -Follow up in 1 year.
